# Patient Record
Sex: Female | Born: 1970 | Race: White | Hispanic: No | Smoking: Former smoker
Health system: Southern US, Community
[De-identification: ages and names within clinical notes are randomized; demographics above are authoritative.]

## PROBLEM LIST (undated history)

## (undated) DIAGNOSIS — K219 Gastro-esophageal reflux disease without esophagitis: Secondary | ICD-10-CM

## (undated) DIAGNOSIS — E88819 Insulin resistance, unspecified: Secondary | ICD-10-CM

## (undated) DIAGNOSIS — Z8742 Personal history of other diseases of the female genital tract: Secondary | ICD-10-CM

## (undated) DIAGNOSIS — J189 Pneumonia, unspecified organism: Secondary | ICD-10-CM

## (undated) DIAGNOSIS — T4145XA Adverse effect of unspecified anesthetic, initial encounter: Secondary | ICD-10-CM

## (undated) DIAGNOSIS — E8881 Metabolic syndrome: Secondary | ICD-10-CM

## (undated) DIAGNOSIS — T8859XA Other complications of anesthesia, initial encounter: Secondary | ICD-10-CM

## (undated) DIAGNOSIS — I10 Essential (primary) hypertension: Secondary | ICD-10-CM

## (undated) HISTORY — DX: Gastro-esophageal reflux disease without esophagitis: K21.9

## (undated) HISTORY — DX: Metabolic syndrome: E88.81

## (undated) HISTORY — PX: BREAST LUMPECTOMY: SHX2

## (undated) HISTORY — DX: Personal history of other diseases of the female genital tract: Z87.42

## (undated) HISTORY — DX: Essential (primary) hypertension: I10

## (undated) HISTORY — PX: CERVICAL BIOPSY: SHX590

## (undated) HISTORY — DX: Insulin resistance, unspecified: E88.819

---
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
## 1996-03-08 HISTORY — PX: DILATION AND CURETTAGE OF UTERUS: SHX78

## 1999-03-09 HISTORY — PX: TUBAL LIGATION: SHX77

## 1999-06-03 ENCOUNTER — Ambulatory Visit (HOSPITAL_COMMUNITY): Admission: RE | Admit: 1999-06-03 | Discharge: 1999-06-03 | Payer: Self-pay | Admitting: Obstetrics and Gynecology

## 1999-06-03 ENCOUNTER — Encounter: Payer: Self-pay | Admitting: Obstetrics and Gynecology

## 1999-08-07 ENCOUNTER — Encounter: Payer: Self-pay | Admitting: Obstetrics and Gynecology

## 1999-08-07 ENCOUNTER — Ambulatory Visit (HOSPITAL_COMMUNITY): Admission: RE | Admit: 1999-08-07 | Discharge: 1999-08-07 | Payer: Self-pay | Admitting: Obstetrics and Gynecology

## 1999-09-14 ENCOUNTER — Encounter: Payer: Self-pay | Admitting: Obstetrics and Gynecology

## 1999-09-14 ENCOUNTER — Ambulatory Visit (HOSPITAL_COMMUNITY): Admission: RE | Admit: 1999-09-14 | Discharge: 1999-09-14 | Payer: Self-pay | Admitting: Obstetrics and Gynecology

## 1999-11-20 ENCOUNTER — Encounter (INDEPENDENT_AMBULATORY_CARE_PROVIDER_SITE_OTHER): Payer: Self-pay | Admitting: Specialist

## 1999-11-20 ENCOUNTER — Inpatient Hospital Stay (HOSPITAL_COMMUNITY): Admission: AD | Admit: 1999-11-20 | Discharge: 1999-11-23 | Payer: Self-pay | Admitting: Obstetrics

## 2002-11-27 ENCOUNTER — Other Ambulatory Visit: Admission: RE | Admit: 2002-11-27 | Discharge: 2002-11-27 | Payer: Self-pay | Admitting: Obstetrics and Gynecology

## 2003-12-31 ENCOUNTER — Other Ambulatory Visit: Admission: RE | Admit: 2003-12-31 | Discharge: 2003-12-31 | Payer: Self-pay | Admitting: Obstetrics and Gynecology

## 2004-01-08 ENCOUNTER — Ambulatory Visit: Payer: Self-pay | Admitting: Internal Medicine

## 2004-01-17 ENCOUNTER — Ambulatory Visit: Payer: Self-pay | Admitting: Internal Medicine

## 2004-03-10 ENCOUNTER — Ambulatory Visit: Payer: Self-pay | Admitting: Internal Medicine

## 2004-03-30 ENCOUNTER — Ambulatory Visit: Payer: Self-pay | Admitting: Internal Medicine

## 2004-05-08 ENCOUNTER — Ambulatory Visit: Payer: Self-pay | Admitting: Internal Medicine

## 2004-06-03 ENCOUNTER — Ambulatory Visit: Payer: Self-pay | Admitting: Internal Medicine

## 2004-06-07 ENCOUNTER — Emergency Department (HOSPITAL_COMMUNITY): Admission: EM | Admit: 2004-06-07 | Discharge: 2004-06-07 | Payer: Self-pay | Admitting: Emergency Medicine

## 2004-06-10 ENCOUNTER — Ambulatory Visit: Payer: Self-pay | Admitting: Internal Medicine

## 2004-06-17 ENCOUNTER — Ambulatory Visit: Payer: Self-pay | Admitting: Internal Medicine

## 2004-07-31 ENCOUNTER — Ambulatory Visit: Payer: Self-pay | Admitting: Internal Medicine

## 2004-10-05 ENCOUNTER — Ambulatory Visit: Payer: Self-pay | Admitting: Internal Medicine

## 2005-01-13 ENCOUNTER — Other Ambulatory Visit: Admission: RE | Admit: 2005-01-13 | Discharge: 2005-01-13 | Payer: Self-pay | Admitting: Obstetrics and Gynecology

## 2005-02-24 ENCOUNTER — Ambulatory Visit: Payer: Self-pay | Admitting: Internal Medicine

## 2005-03-25 ENCOUNTER — Ambulatory Visit: Payer: Self-pay | Admitting: Internal Medicine

## 2005-04-01 ENCOUNTER — Ambulatory Visit: Payer: Self-pay | Admitting: Internal Medicine

## 2005-04-19 ENCOUNTER — Ambulatory Visit: Payer: Self-pay | Admitting: Internal Medicine

## 2005-10-05 ENCOUNTER — Ambulatory Visit: Payer: Self-pay | Admitting: Internal Medicine

## 2005-10-11 ENCOUNTER — Ambulatory Visit: Payer: Self-pay | Admitting: Internal Medicine

## 2005-11-05 ENCOUNTER — Ambulatory Visit (HOSPITAL_COMMUNITY): Admission: RE | Admit: 2005-11-05 | Discharge: 2005-11-05 | Payer: Self-pay | Admitting: Internal Medicine

## 2005-11-06 ENCOUNTER — Emergency Department (HOSPITAL_COMMUNITY): Admission: EM | Admit: 2005-11-06 | Discharge: 2005-11-06 | Payer: Self-pay | Admitting: Family Medicine

## 2005-11-09 ENCOUNTER — Ambulatory Visit: Payer: Self-pay | Admitting: Internal Medicine

## 2005-11-12 ENCOUNTER — Ambulatory Visit: Payer: Self-pay | Admitting: Internal Medicine

## 2005-11-17 ENCOUNTER — Ambulatory Visit: Payer: Self-pay | Admitting: Internal Medicine

## 2006-03-03 IMAGING — US US ABDOMEN COMPLETE
1 series · 14 of 25 positions shown · non-contrast
Comparison: none

CLINICAL DATA: Bilateral upper quadrant abdominal pain.  
 ABDOMINAL ULTRASOUND COMPLETE:
 No comparison. 
 The gallbladder appears normal without gallstones or wall thickening.  There is no biliary dilatation.  Views of the liver, spleen, pancreas, inferior vena cava and abdominal aorta are unremarkable.  Portions of the distal aorta are obscured by bowel gas.  
 The right kidney measures 11.1 cm in length and demonstrates mild pelviectasis.  There is no apparent caliectasis.   Centrally in the left kidney, there is an anechoic parapelvic cyst measuring approximately 4.4 x 3.9 x 3.9 cm.  The left kidney demonstrates no pelviectasis or focal cortical abnormality.

[Series 1: abdomen · 0.33mm/px · 14 of 73 slices shown]
[im 1/73]
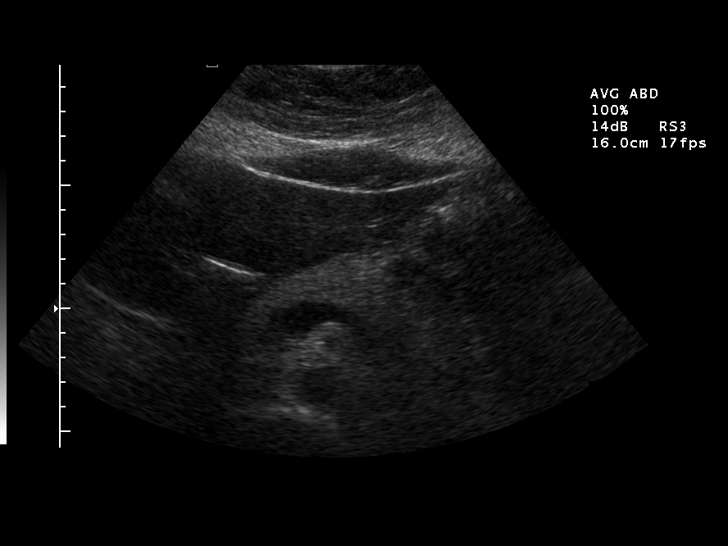
[im 7/73]
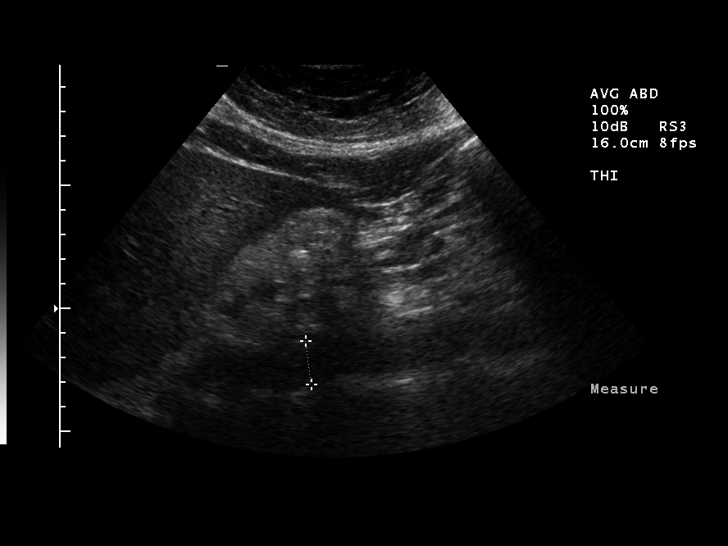
[im 13/73]
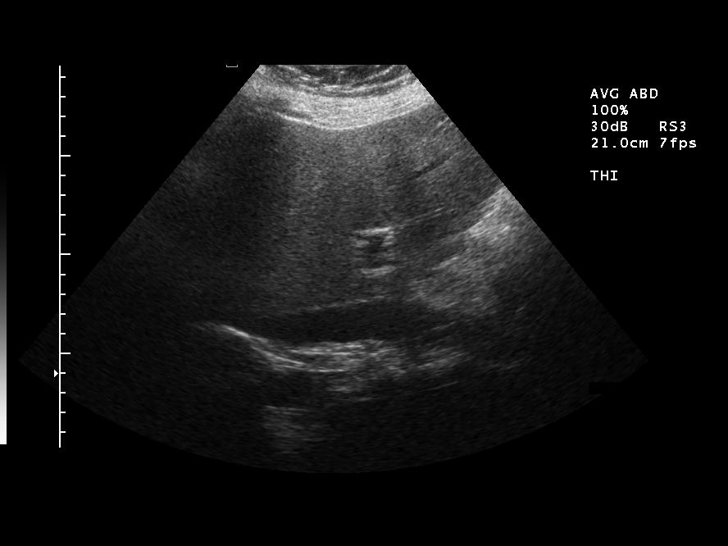
[im 19/73]
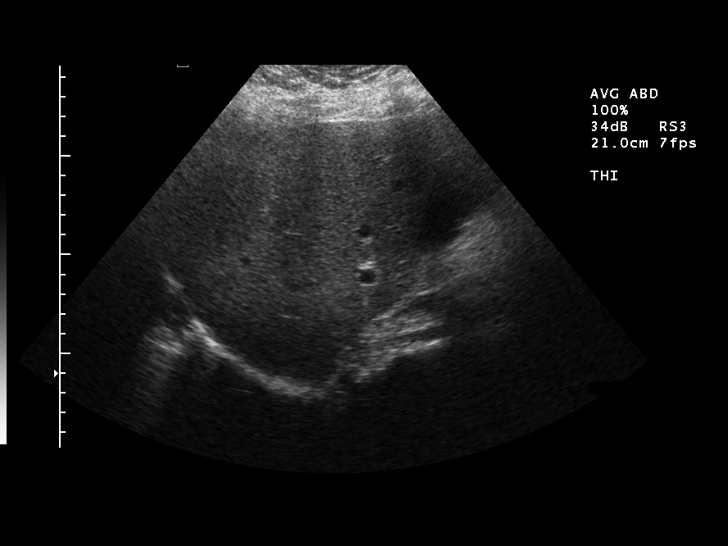
[im 25/73]
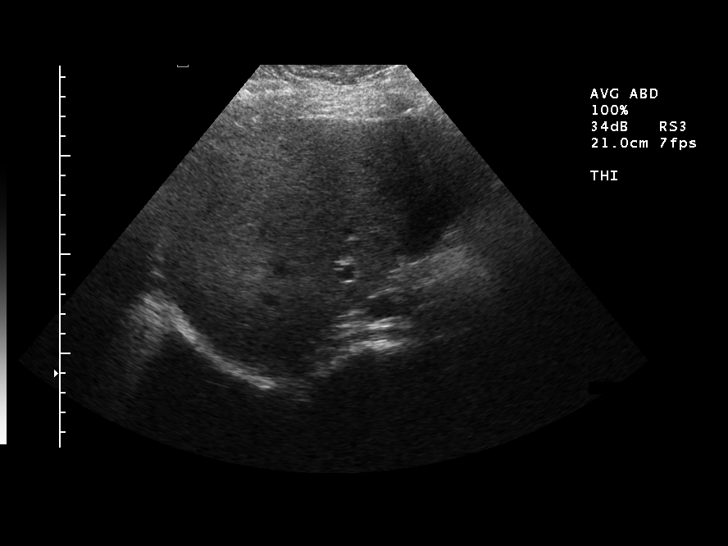
[im 28/73]
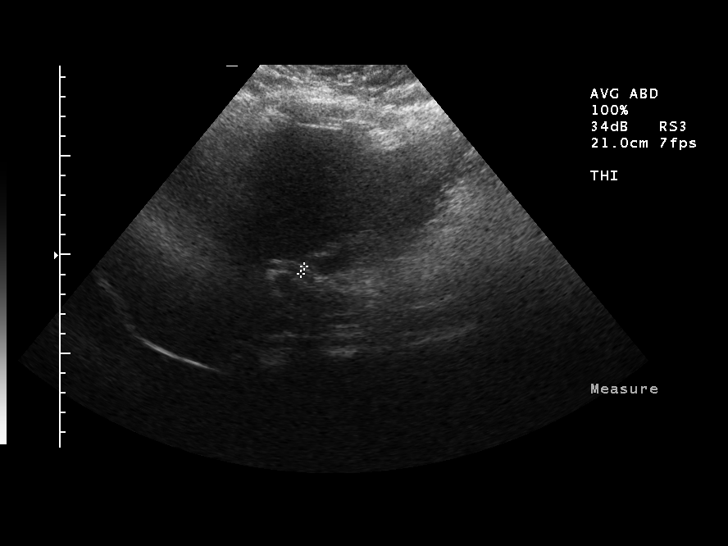
[im 34/73]
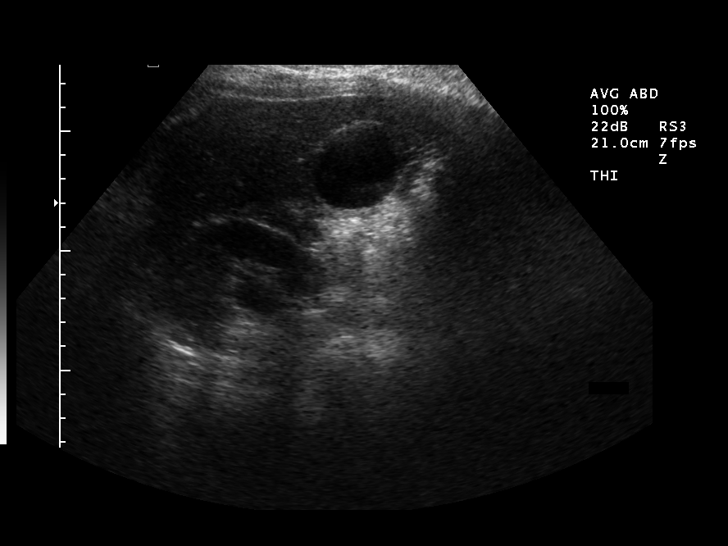
[im 40/73]
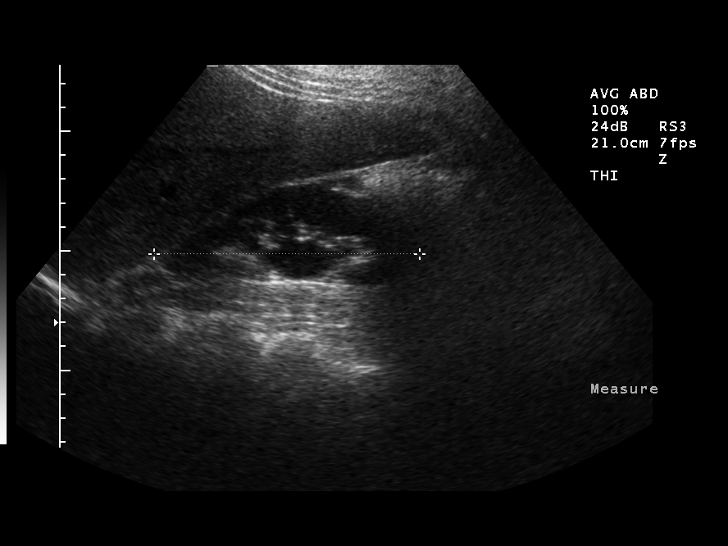
[im 46/73]
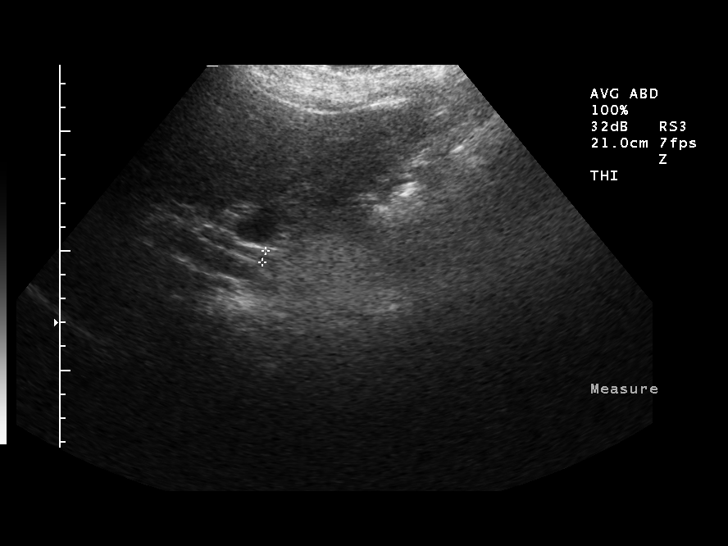
[im 49/73]
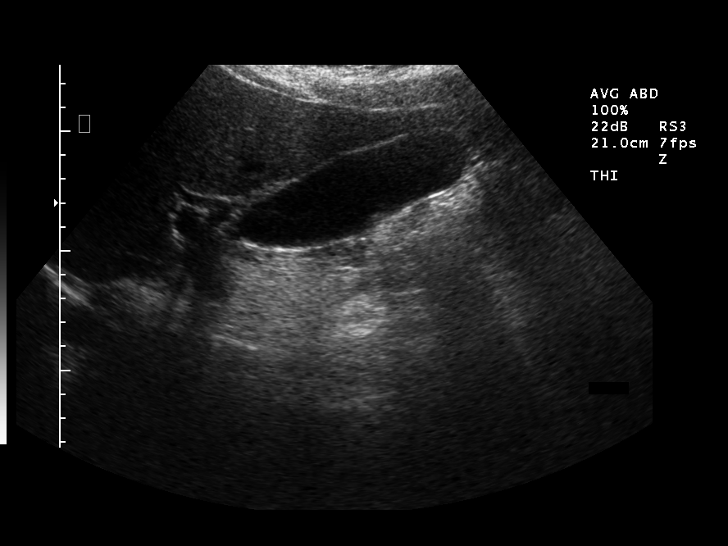
[im 55/73]
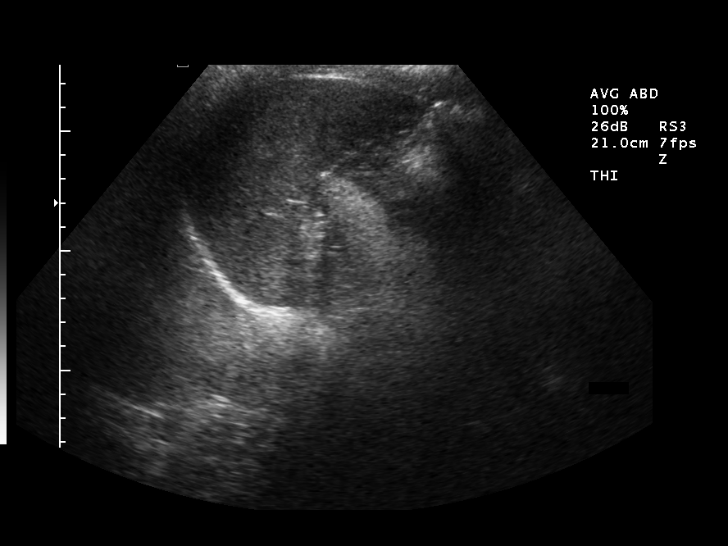
[im 61/73]
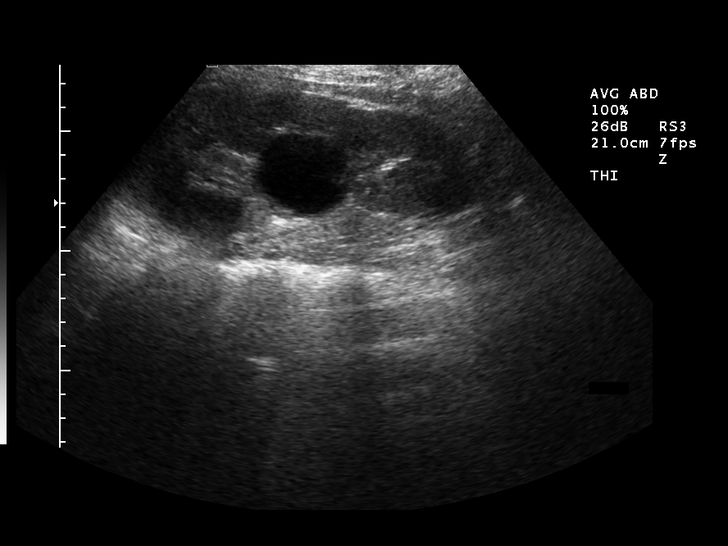
[im 67/73]
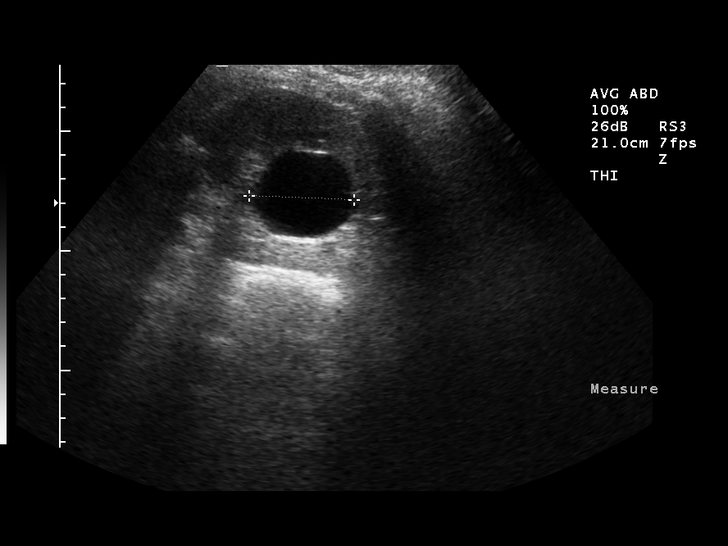
[im 73/73]
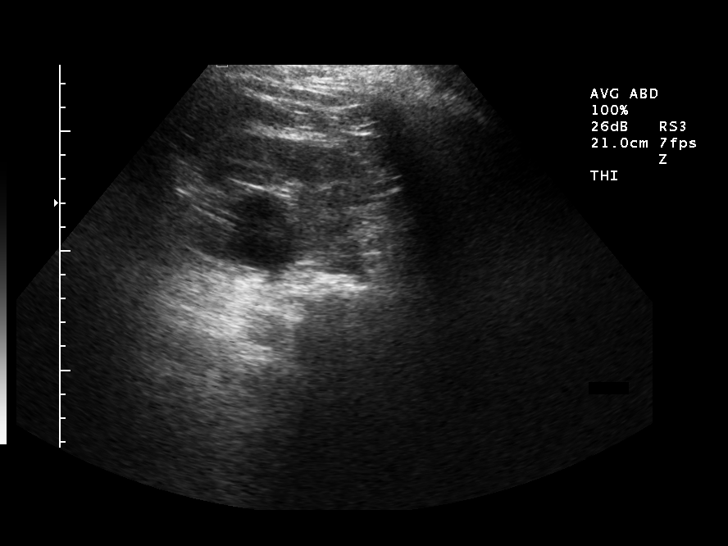

[14 of 25 positions shown; findings below may reference images not displayed]

IMPRESSION: 1.  No definite acute findings.  There is mild prominence of the right renal collecting system without definite caliectasis.  A parapelvic cyst is noted centrally in the left kidney. 
 2.  No evidence of cholelithiasis or biliary dilatation.

## 2006-03-16 ENCOUNTER — Ambulatory Visit: Payer: Self-pay | Admitting: Internal Medicine

## 2006-03-16 LAB — CONVERTED CEMR LAB
ALT: 26 units/L (ref 0–40)
AST: 21 units/L (ref 0–37)
Albumin: 4 g/dL (ref 3.5–5.2)
Alkaline Phosphatase: 57 units/L (ref 39–117)
BUN: 17 mg/dL (ref 6–23)
Basophils Absolute: 0.1 10*3/uL (ref 0.0–0.1)
Basophils Relative: 1.3 % — ABNORMAL HIGH (ref 0.0–1.0)
CO2: 28 meq/L (ref 19–32)
Calcium: 9.7 mg/dL (ref 8.4–10.5)
Chloride: 111 meq/L (ref 96–112)
Chol/HDL Ratio, serum: 3.8
Cholesterol: 149 mg/dL (ref 0–200)
Creatinine, Ser: 0.8 mg/dL (ref 0.4–1.2)
Eosinophil percent: 1.8 % (ref 0.0–5.0)
GFR calc non Af Amer: 87 mL/min
Glomerular Filtration Rate, Af Am: 105 mL/min/{1.73_m2}
Glucose, Bld: 0 mg/dL — CL (ref 70–99)
HCT: 42 % (ref 36.0–46.0)
HDL: 38.9 mg/dL — ABNORMAL LOW (ref >39.0)
Hemoglobin: 14.2 g/dL (ref 12.0–15.0)
LDL Cholesterol: 88 mg/dL (ref 0–99)
Lymphocytes Relative: 29.6 % (ref 12.0–46.0)
MCHC: 33.8 g/dL (ref 30.0–36.0)
MCV: 87.7 fL (ref 78.0–100.0)
Monocytes Absolute: 0.5 10*3/uL (ref 0.2–0.7)
Monocytes Relative: 6.6 % (ref 3.0–11.0)
Neutro Abs: 4.7 10*3/uL (ref 1.4–7.7)
Neutrophils Relative %: 60.7 % (ref 43.0–77.0)
Platelets: 326 10*3/uL (ref 150–400)
Potassium: 3.8 meq/L (ref 3.5–5.1)
RBC: 4.79 M/uL (ref 3.87–5.11)
RDW: 12.9 % (ref 11.5–14.6)
Sodium: 143 meq/L (ref 135–145)
TSH: 2.48 microintl units/mL (ref 0.35–5.50)
Total Bilirubin: 0.8 mg/dL (ref 0.3–1.2)
Total Protein: 7.1 g/dL (ref 6.0–8.3)
Triglyceride fasting, serum: 110 mg/dL (ref 0–149)
VLDL: 22 mg/dL (ref 0–40)
WBC: 7.6 10*3/uL (ref 4.5–10.5)

## 2006-03-17 ENCOUNTER — Encounter: Payer: Self-pay | Admitting: Internal Medicine

## 2006-03-17 LAB — CONVERTED CEMR LAB: Glucose, Bld: 83 mg/dL (ref 70–99)

## 2006-03-23 ENCOUNTER — Ambulatory Visit: Payer: Self-pay | Admitting: Internal Medicine

## 2006-03-24 ENCOUNTER — Encounter: Payer: Self-pay | Admitting: Internal Medicine

## 2006-06-06 ENCOUNTER — Ambulatory Visit: Payer: Self-pay | Admitting: Internal Medicine

## 2006-09-08 ENCOUNTER — Ambulatory Visit: Payer: Self-pay | Admitting: Internal Medicine

## 2006-09-29 ENCOUNTER — Telehealth: Payer: Self-pay | Admitting: Internal Medicine

## 2006-10-05 ENCOUNTER — Telehealth: Payer: Self-pay | Admitting: Internal Medicine

## 2006-12-27 ENCOUNTER — Ambulatory Visit: Payer: Self-pay | Admitting: Internal Medicine

## 2006-12-27 DIAGNOSIS — I10 Essential (primary) hypertension: Secondary | ICD-10-CM | POA: Insufficient documentation

## 2006-12-27 DIAGNOSIS — K219 Gastro-esophageal reflux disease without esophagitis: Secondary | ICD-10-CM

## 2008-02-07 ENCOUNTER — Ambulatory Visit: Payer: Self-pay | Admitting: Internal Medicine

## 2008-02-12 LAB — CONVERTED CEMR LAB
BUN: 17 mg/dL (ref 6–23)
CO2: 26 meq/L (ref 19–32)
Calcium: 9.5 mg/dL (ref 8.4–10.5)
Chloride: 104 meq/L (ref 96–112)
Creatinine, Ser: 0.8 mg/dL (ref 0.4–1.2)
GFR calc Af Amer: 104 mL/min
GFR calc non Af Amer: 86 mL/min
Glucose, Bld: 80 mg/dL (ref 70–99)
Potassium: 3.7 meq/L (ref 3.5–5.1)
Sodium: 138 meq/L (ref 135–145)

## 2008-03-19 ENCOUNTER — Ambulatory Visit: Payer: Self-pay | Admitting: Internal Medicine

## 2008-03-19 DIAGNOSIS — J069 Acute upper respiratory infection, unspecified: Secondary | ICD-10-CM | POA: Insufficient documentation

## 2008-06-06 ENCOUNTER — Emergency Department (HOSPITAL_COMMUNITY): Admission: EM | Admit: 2008-06-06 | Discharge: 2008-06-06 | Payer: Self-pay | Admitting: Family Medicine

## 2008-07-03 ENCOUNTER — Emergency Department (HOSPITAL_COMMUNITY): Admission: EM | Admit: 2008-07-03 | Discharge: 2008-07-03 | Payer: Self-pay | Admitting: Emergency Medicine

## 2008-08-22 ENCOUNTER — Ambulatory Visit: Payer: Self-pay | Admitting: Internal Medicine

## 2008-08-22 DIAGNOSIS — L723 Sebaceous cyst: Secondary | ICD-10-CM

## 2009-01-06 ENCOUNTER — Ambulatory Visit: Payer: Self-pay | Admitting: Internal Medicine

## 2009-04-29 ENCOUNTER — Telehealth: Payer: Self-pay | Admitting: Internal Medicine

## 2010-04-02 ENCOUNTER — Telehealth: Payer: Self-pay | Admitting: Internal Medicine

## 2010-04-07 NOTE — Progress Notes (Signed)
Summary: gastritis  Phone Note Call from Patient   Caller: Patient Call For: Birdie Sons MD Reason for Call: Acute Illness Summary of Call: Pt is asking for pain meds for an acute episode of gastritis.  No insurance or money for OV. [phone] Initial call taken by: Lynann Beaver CMA,  April 29, 2009 10:02 AM  Follow-up for Phone Call        what are sxs?  She has the same symptoms as before...epigasric pain that radiates to back.  No V, D or melena.  No fever. Takes Prilosec, but pain is better after taking a pill pill of her Mom's.?  Vicodin? Follow-up by: Birdie Sons MD,  April 30, 2009 8:30 AM  Additional Follow-up for Phone Call Additional follow up Details #1::        advise OTC - zantac 75 mg by mouth once daily  Additional Follow-up by: Birdie Sons MD,  April 30, 2009 10:33 AM    Additional Follow-up for Phone Call Additional follow up Details #2::    Pt given Dr. Marliss Coots recommendations. Follow-up by: Lynann Beaver CMA,  April 30, 2009 10:46 AM

## 2010-04-09 NOTE — Progress Notes (Signed)
Summary: refill  Phone Note Call from Patient Call back at [facility] Phone [phone]   Summary of Call: Needs refills of Lisniopril sent to [facility].  No insurance. Initial call taken by: [facility] CMA AAMA,  April 02, 2010 11:13 AM  Follow-up for Phone Call        Dr Cato Mulligan has not seen pt since 2009.  She came in 2010 to see Dr Kirtland Bouchard for acute illness Follow-up by: Alfred Levins, CMA,  April 02, 2010 12:03 PM  Additional Follow-up for Phone Call Additional follow up Details #1::        refill #30---needs OV Additional Follow-up by: Birdie Sons MD,  April 03, 2010 12:23 PM    Prescriptions: LISINOPRIL-HYDROCHLOROTHIAZIDE 20-25 MG TABS (LISINOPRIL-HYDROCHLOROTHIAZIDE) Take 1 tablet by mouth once a day  #30 x 0   Entered by:   Alfred Levins, CMA   Authorized by:   Birdie Sons MD   Signed by:   Alfred Levins, CMA on 04/03/2010   Method used:   Electronically to        [facility] [phone]* (retail)       [address]       [place], [place]  [address]       Ph: [phone]       Fax: [phone]   RxID:   [id number]

## 2010-07-24 NOTE — Discharge Summary (Signed)
[facility]  Patient:    Kerri Murillo                  MRN: [id number] Adm. Date:  [id number] Disc. Date: [id number] Attending:  Shaune Spittle Dictator:   Vance Gather Duplantis, C.N.M.                           Discharge Summary  ADMISSION DIAGNOSES:          1. Intrauterine pregnancy at 41-5/7 weeks.                               2. Unfavorable cervix.                               3. Negative group B strep.                               4. Multiparity, desires bilateral tubal                                  ligation.  DISCHARGE DIAGNOSES:          1. Intrauterine pregnancy at 41-5/7 weeks.                               2. Unfavorable cervix.                               3. Negative group B strep.                               4. Multiparity, desires bilateral tubal                                  ligation.                               5. Breast-feeding.  PROCEDURES:                   1. Normal spontaneous vaginal delivery of a                                  viable female infant named Alcario Drought, Apgars of 8                                  and 9, weight 7 pounds 7 ounces, on November 21, 1999, attended by [employer],  C.N.M.                               2. Postpartum bilateral tubal ligation on                                  November 22, 1999, by Dr. Dierdre Forth.  HOSPITAL COURSE:              Kerri Murillo is a 40 year old single white female, gravida 4, para 1-0-2-1, at 41-5/7 weeks, who was admitted for induction of labor secondary to postdates.  She received Cytotec two times for cervical ripening, and progressed into active labor on Cytotec.  She delivered spontaneously a viable female infant named Alcario Drought, who had Apgars of 8 and 9 and weighed 7 pounds 7 ounces, from a direct OP position at 6:54 a.m. on November 21, 1999.  She was attended at her  delivery by Vance Gather Duplantis, C.N.M.  She desired bilateral tubal ligation for sterilization and underwent the same on November 21, 1999, without complication, attended by Dr. Dierdre Forth.  Please see operative note for details.  Her postoperative course has been uneventful.  She is ambulating, voiding, and eating without difficulty.  Her vital signs are stable, and she is afebrile. She is breast-feeding also without difficulty.  She is deemed ready for discharge today.  DISCHARGE INSTRUCTIONS:       As per the [facility] handout.  DISCHARGE MEDICATIONS:        1. Motrin 600 mg p.o. q.6h. p.r.n. for pain.                               2. Tylox 1-2 p.o. q.4-6h. p.r.n. for pain.  DISCHARGE LABORATORY DATA:    Hemoglobin 11.9, WBC count 14.4, platelets 206.  FOLLOW-UP:                    Will be in six weeks at [facility] or p.r.n. DD:  11/23/99 TD:  11/24/99 Job: [id number]

## 2010-07-24 NOTE — Op Note (Signed)
[facility]  Patient:    Kerri Murillo                  MRN: [id number] Proc. Date: 11/22/99 Adm. Date:  [id number] Disc. Date: [id number] Attending:  Dierdre Forth [place]                           Operative Report  PREOPERATIVE DIAGNOSIS:       Desire for surgical sterilization.  POSTOPERATIVE DIAGNOSIS:      Desire for surgical sterilization.  OPERATION:                    Postpartum tubal sterilization.  SURGEON:                      Vanessa P. Pennie Rushing, M.D.  ANESTHESIA:                   General orotracheal.  ESTIMATED BLOOD LOSS:         Less than 20 cc.  COMPLICATIONS:                No surgical complications.  FINDINGS:                     The tubes were normal for the postpartum state.  DESCRIPTION OF PROCEDURE:     The patient was taken to the operating room after appropriate identification and placement on the operating table.  After the obtainment of adequate general anesthesia, the abdomen was prepped with multiple layers of Betadine and draped as a sterile field.  The subumbilical area was infiltrated with 6 cc of 0.25% Marcaine.  A subumbilical incision was made and blunt dissection allowed identification of the fascia which was incised and the peritoneum which was incised.  The left fallopian tube was then identified, followed to its fimbriated end, and then grasped at the isthmic portion.  A suture of 2-0 chromic was placed through the mesosalpinx and tied _______ on the knuckle of tube.  A second ligature was placed proximal to that, and the intervening knuckle of tube excised, then cauterized at the end.  A similar procedure was carried out on the opposite side.  The abdominal peritoneum was closed with a running suture of 0 Vicryl.  The fascia was closed with a running suture of 0 Vicryl.  The subcutaneous tissue was copiously irrigated and noted to be hemostatic.  A subcuticular suture of 3-0 Vicryl was used to  close the skin incision and a sterile dressing applied.                                The patient was awakened from general anesthesia and taken to the recovery room in satisfactory condition having tolerated the procedure well with sponge and instrument counts correct. DD:  11/22/99 TD:  11/24/99 Job: [id number]

## 2010-07-24 NOTE — H&P (Signed)
[facility]  Patient:    Kerri Murillo                  MRN: [id number] Adm. Date:  [id number] Disc. Date: [id number] Attending:  Shaune Spittle Dictator:   Vance Gather Duplantis, C.N.M.                         History and Physical  HISTORY OF PRESENT ILLNESS:   [name] is a 40 year old, single white female Gravida 4, Para 1-0-2-1 at 41-5/7 weeks who presents for induction of labor secondary to being post dates.  She denies nausea, vomiting, headaches, or visual disturbances.  She denies any leaking or vaginal bleeding.  She reports positive fetal movement.  Her pregnancy has been followed at [facility] by the certified nurse midwife service and has been essentially uncomplicated though at risk for history of smoking, obesity, post dates and toxemia with her previous pregnancy.  Her Group B strep is negative.  OB/GYN HISTORY:               She is a Gravida 4, Para 1-0-2-1 who had an elective abortion in 1989 with no complications.  She delivered a viable female in 1995 who weighted 8 pounds 7 ounces at [redacted] weeks gestation following a 13 hour labor.  She reports that with that labor her water broke and she required Pitocin for entering labor.  She also reports that she toxemia with that pregnancy.  In 1998 she had a miscarriage at five weeks also without complication.  Other GYN history showed she did have abnormal cells and had a conization in 1995 following her delivery but her Paps have been normal since. She did have Chlamydia at age 79 but she has been treated and her cultures have been negative since.  ALLERGIES:                    She has no known drug allergies.  GENERAL MEDICAL HISTORY:      She reports having had the usual childhood diseases.  She reports occasional urinary tract infections and she smoked prior to this pregnancy and tried to cut back during this pregnancy but still continues to smoke  some.  PAST SURGICAL HISTORY:        Her only surgery was the conization in 1995 and tonsils at age 67 and appendix at age 44.  FAMILY HISTORY:               Significant for mother with heart disease, maternal grandmother and grandfather with heart attack.  Maternal grandmother with asthma.  Mother with type 2 diabetes requiring pills.  Maternal aunt with questionable thyroid problems.  GENETIC HISTORY:              Negative.  SOCIAL HISTORY:               The patient is single. She lives with her mom. The father of the baby is not involved.  She denies any illicit drug use or alcohol or smoking once finding out she was pregnant.  PRENATAL LABORATORY DATA:     Blood type is A positive.  Her antibody screen is negative.  Syphilis is nonreactive.  Rubella was equivocal.  Hepatitis B surface antigen is negative.  HIV is nonreactive.  GC and Chlamydia are both negative.  One hour Glucola was within normal range.  Maternal serum alpha fetoprotein was declined.  Thirty-six week  beta Streptococcus was negative.  PHYSICAL EXAMINATION:  VITAL SIGNS:                  Stable.  She is afebrile.  HEENT:                        Grossly within normal limits.  HEART:                        Regular rhythm and rate.  CHEST:                        Clear.  BREASTS:                      Soft and nontender.  ABDOMEN:                      Gravid with irregular mild uterine contractions. Her fetal heart rate is very reactive and reassuring.  PELVIC:                       Closed, 50% vertex minus 2 to minus 3 and soft. EXTREMITIES:                  Within normal limits.  ASSESSMENT:                   1. Intrauterine pregnancy at 41-5/7 weeks.                               2. Unfavorable cervix.                               3. Negative Group B Streptococcus.  PLAN:                         Admit to labor and delivery for induction of labor.  Dr. Dierdre Forth has been notified of patients  admission.  We will plan Cytotec for cervical ripening and Pitocin in the morning if required. DD:  11/22/99 TD:  11/23/99 Job: [id number]

## 2010-08-04 ENCOUNTER — Encounter: Payer: Self-pay | Admitting: Internal Medicine

## 2010-08-04 ENCOUNTER — Ambulatory Visit (INDEPENDENT_AMBULATORY_CARE_PROVIDER_SITE_OTHER): Payer: Self-pay | Admitting: Internal Medicine

## 2010-08-04 DIAGNOSIS — I10 Essential (primary) hypertension: Secondary | ICD-10-CM

## 2010-08-04 MED ORDER — LISINOPRIL-HYDROCHLOROTHIAZIDE 20-25 MG PO TABS: 1.0000 | ORAL_TABLET | Freq: Every day | ORAL | Status: DC

## 2010-08-04 NOTE — Progress Notes (Signed)
  Subjective:    Patient ID: Kerri Murillo, female    DOB: 30-Apr-1970, 40 y.o.   MRN: [id number]  HPI Has been intermittently taking BP meds (no insurance)  Past Medical History  Diagnosis Date  . GERD (gastroesophageal reflux disease)   . Hypertension    Past Surgical History  Procedure Date  . Appendectomy   . Tubal ligation   . Tonsillectomy     reports that she has been smoking.  She does not have any smokeless tobacco history on file. Her alcohol and drug histories not on file. family history includes Diabetes in her mother; Heart attack in her mother; and Stroke in her mother. No Known Allergies     Review of Systems     Objective:   Physical Exam  Well-developed well-nourished female in no acute distress. HEENT exam atraumatic, normocephalic, extraocular muscles are intact. Neck is supple. No jugular venous distention no thyromegaly. Chest clear to auscultation without increased work of breathing. Cardiac exam S1 and S2 are regular. Abdominal exam active bowel sounds, soft, nontende, morbidly obeser. Extremities no edema. Neurologic exam she is alert without any motor sensory deficits. Gait is normal.        Assessment & Plan:

## 2010-08-04 NOTE — Assessment & Plan Note (Signed)
Discussed need for compliance

## 2010-10-27 ENCOUNTER — Telehealth: Payer: Self-pay | Admitting: Internal Medicine

## 2010-10-27 NOTE — Telephone Encounter (Signed)
Medicaid will not let us accept their insurance

## 2010-10-27 NOTE — Telephone Encounter (Signed)
Pt has not had insurance for quite some time and recently got medicade. Pt wanted to know if you excepted Medicade pt since she is existing.

## 2011-01-26 ENCOUNTER — Other Ambulatory Visit: Payer: Self-pay | Admitting: Obstetrics and Gynecology

## 2011-01-26 DIAGNOSIS — Z1231 Encounter for screening mammogram for malignant neoplasm of breast: Secondary | ICD-10-CM

## 2011-02-18 ENCOUNTER — Other Ambulatory Visit (INDEPENDENT_AMBULATORY_CARE_PROVIDER_SITE_OTHER): Payer: 59

## 2011-02-18 ENCOUNTER — Ambulatory Visit
Admission: RE | Admit: 2011-02-18 | Discharge: 2011-02-18 | Disposition: A | Discharge status: Home / Self Care | Payer: 59 | Source: Ambulatory Visit | Attending: Obstetrics and Gynecology | Admitting: Obstetrics and Gynecology

## 2011-02-18 ENCOUNTER — Other Ambulatory Visit: Payer: Self-pay | Admitting: Obstetrics and Gynecology

## 2011-02-18 DIAGNOSIS — Z0000 Encounter for general adult medical examination without abnormal findings: Secondary | ICD-10-CM

## 2011-02-18 DIAGNOSIS — Z1231 Encounter for screening mammogram for malignant neoplasm of breast: Secondary | ICD-10-CM

## 2011-02-18 LAB — HEPATIC FUNCTION PANEL
AST: 15 U/L (ref 0–37)
Alkaline Phosphatase: 58 U/L (ref 39–117)
Bilirubin, Direct: 0 mg/dL (ref 0.0–0.3)
Total Protein: 7.4 g/dL (ref 6.0–8.3)

## 2011-02-18 LAB — POCT URINALYSIS DIPSTICK
Bilirubin, UA: NEGATIVE
Nitrite, UA: NEGATIVE
Protein, UA: NEGATIVE
pH, UA: 7

## 2011-02-18 LAB — CBC WITH DIFFERENTIAL/PLATELET
Basophils Relative: 0.6 % (ref 0.0–3.0)
Eosinophils Absolute: 0.1 10*3/uL (ref 0.0–0.7)
Eosinophils Relative: 1.2 % (ref 0.0–5.0)
Lymphocytes Relative: 23.8 % (ref 12.0–46.0)
MCHC: 33.7 g/dL (ref 30.0–36.0)
Monocytes Relative: 6.1 % (ref 3.0–12.0)
Neutrophils Relative %: 68.3 % (ref 43.0–77.0)
RBC: 4.52 Mil/uL (ref 3.87–5.11)
WBC: 9.4 10*3/uL (ref 4.5–10.5)

## 2011-02-18 LAB — BASIC METABOLIC PANEL
Calcium: 9.3 mg/dL (ref 8.4–10.5)
Creatinine, Ser: 0.7 mg/dL (ref 0.4–1.2)
GFR: 99.99 mL/min (ref >60.00)
Sodium: 141 mEq/L (ref 135–145)

## 2011-02-18 LAB — LIPID PANEL
HDL: 42.8 mg/dL (ref >39.00)
Total CHOL/HDL Ratio: 3

## 2011-02-26 ENCOUNTER — Encounter: Payer: Self-pay | Admitting: Internal Medicine

## 2011-02-26 ENCOUNTER — Ambulatory Visit (INDEPENDENT_AMBULATORY_CARE_PROVIDER_SITE_OTHER): Payer: 59 | Admitting: Internal Medicine

## 2011-02-26 VITALS — BP 144/86 | HR 84 | Temp 98.2°F | Ht 66.0 in | Wt 350.0 lb

## 2011-02-26 DIAGNOSIS — E669 Obesity, unspecified: Secondary | ICD-10-CM

## 2011-02-26 DIAGNOSIS — I10 Essential (primary) hypertension: Secondary | ICD-10-CM

## 2011-02-26 MED ORDER — LISINOPRIL-HYDROCHLOROTHIAZIDE 20-25 MG PO TABS: 1.0000 | ORAL_TABLET | Freq: Every day | ORAL | Status: DC

## 2011-02-26 NOTE — Progress Notes (Signed)
[facility]  Past Medical History  Diagnosis Date  . GERD (gastroesophageal reflux disease)   . Hypertension     History   Social History  . Marital Status: Widowed    Spouse Name: N/A    Number of Children: N/A  . Years of Education: N/A   Occupational History  . Not on file.   Social History Main Topics  . Smoking status: Current Some Day Smoker  . Smokeless tobacco: Not on file  . Alcohol Use: Yes  . Drug Use: No  . Sexually Active: Not on file   Other Topics Concern  . Not on file   Social History Narrative  . No narrative on file    Past Surgical History  Procedure Date  . Appendectomy   . Tubal ligation   . Tonsillectomy     Family History  Problem Relation Age of Onset  . Diabetes Mother   . Stroke Mother   . Heart attack Mother     pacemaker    No Known Allergies  Current Outpatient Prescriptions on File Prior to Visit  Medication Sig Dispense Refill  . omeprazole (PRILOSEC) 20 MG capsule Take 20 mg by mouth daily.           patient denies chest pain, shortness of breath, orthopnea. Denies lower extremity edema, abdominal pain, change in appetite, change in bowel movements. Patient denies rashes, musculoskeletal complaints. No other specific complaints in a complete review of systems.   BP 144/86  Pulse 84  Temp(Src) 98.2 F (36.8 C) (Oral)  Ht 5\' 6"  (1.676 m)  Wt 350 lb (158.759 kg)  BMI 56.49 kg/m2  LMP 01/20/2011  Well-developed well-nourished female in no acute distress. HEENT exam atraumatic, normocephalic, extraocular muscles are intact. Neck is supple. No jugular venous distention no thyromegaly. Chest clear to auscultation without increased work of breathing. Cardiac exam S1 and S2 are regular. Abdominal exam active bowel sounds, soft, nontender. Morbidly obese. Extremities no edema. Neurologic exam she is alert without any motor sensory deficits. Gait is normal.  A/P: well visit, health maint UTD

## 2011-03-22 ENCOUNTER — Encounter: Payer: Self-pay | Admitting: Family

## 2011-03-22 ENCOUNTER — Ambulatory Visit (INDEPENDENT_AMBULATORY_CARE_PROVIDER_SITE_OTHER): Payer: 59 | Admitting: Family

## 2011-03-22 VITALS — BP 128/88 | Temp 98.7°F | Wt 368.0 lb

## 2011-03-22 DIAGNOSIS — J209 Acute bronchitis, unspecified: Secondary | ICD-10-CM

## 2011-03-22 DIAGNOSIS — J019 Acute sinusitis, unspecified: Secondary | ICD-10-CM

## 2011-03-22 MED ORDER — GUAIFENESIN-CODEINE 100-10 MG/5ML PO SYRP: 5.0000 mL | ORAL_SOLUTION | Freq: Three times a day (TID) | ORAL | Status: AC | PRN

## 2011-03-22 MED ORDER — FLUTICASONE PROPIONATE 50 MCG/ACT NA SUSP: 2.0000 | Freq: Every day | NASAL | Status: DC

## 2011-03-22 NOTE — Progress Notes (Signed)
  Subjective:    Patient ID: Kerri Murillo, female    DOB: [birth year], 41 y.o.   MRN: [id number]  HPI 41 year old white female, nonsmoker, patient of Dr. Cato Mulligan is in today with cough, chest and nasal congestion, sore throat and pain in her right ear that's been present for 3 days. He has been taking Mucinex and has not helped her symptoms. She denies any fever, muscle aches, or pain. It   Review of Systems  Constitutional: Negative.   HENT: Positive for congestion and postnasal drip.   Respiratory: Positive for cough.   Cardiovascular: Negative.   Genitourinary: Negative.   Musculoskeletal: Negative.   Skin: Negative.   Neurological: Negative.   Hematological: Negative.   Psychiatric/Behavioral: Negative.        Objective:   Physical Exam  Constitutional: She is oriented to person, place, and time. She appears well-developed and well-nourished.  HENT:  Right Ear: External ear normal.  Left Ear: External ear normal.  Nose: Nose normal.  Mouth/Throat: Oropharynx is clear and moist.  Cardiovascular: Normal rate, regular rhythm and normal heart sounds.   Pulmonary/Chest: Effort normal and breath sounds normal.  Musculoskeletal: Normal range of motion.  Neurological: She is alert and oriented to person, place, and time.  Skin: Skin is warm and dry.  Psychiatric: She has a normal mood and affect.          Assessment & Plan:  Assessment: Acute sinusitis, bronchitis-likely viral  Plan: Flonase 2 sprays any special once a day, Robitussin-AC 3 times a day when necessary for cough. Warned of drowsiness. Mother, Joyce Copa once daily. Drink plenty of fluids. Rest. Call the office if symptoms worsen or persist. Recheck as scheduled, and when necessary. Past Medical History  Diagnosis Date  . GERD (gastroesophageal reflux disease)   . Hypertension     History   Social History  . Marital Status: Widowed    Spouse Name: N/A    Number of Children: N/A  . Years of  Education: N/A   Occupational History  . Not on file.   Social History Main Topics  . Smoking status: Current Some Day Smoker  . Smokeless tobacco: Not on file  . Alcohol Use: Yes  . Drug Use: No  . Sexually Active: Not on file   Other Topics Concern  . Not on file   Social History Narrative  . No narrative on file    Past Surgical History  Procedure Date  . Appendectomy   . Tubal ligation   . Tonsillectomy     Family History  Problem Relation Age of Onset  . Diabetes Mother   . Stroke Mother   . Heart attack Mother     pacemaker    No Known Allergies  Current Outpatient Prescriptions on File Prior to Visit  Medication Sig Dispense Refill  . lisinopril-hydrochlorothiazide (PRINZIDE,ZESTORETIC) 20-25 MG per tablet Take 1 tablet by mouth daily.  90 tablet  3  . omeprazole (PRILOSEC) 20 MG capsule Take 20 mg by mouth daily.          BP 128/88  Temp(Src) 98.7 F (37.1 C) (Oral)  Wt 368 lb (166.924 kg)chart

## 2011-03-22 NOTE — Patient Instructions (Addendum)
Sinusitis Sinuses are air pockets within the bones of your face. The growth of bacteria within a sinus leads to infection. The infection prevents the sinuses from draining. This infection is called sinusitis. SYMPTOMS  There will be different areas of pain depending on which sinuses have become infected.  The maxillary sinuses often produce pain beneath the eyes.   Frontal sinusitis may cause pain in the middle of the forehead and above the eyes.  Other problems (symptoms) include:  Toothaches.   Colored, pus-like (purulent) drainage from the nose.   Swelling, warmth, and tenderness over the sinus areas may be signs of infection.  TREATMENT  Sinusitis is most often determined by an exam.X-rays may be taken. If x-rays have been taken, make sure you obtain your results or find out how you are to obtain them. Your caregiver may give you medications (antibiotics). These are medications that will help kill the bacteria causing the infection. You may also be given a medication (decongestant) that helps to reduce sinus swelling.  HOME CARE INSTRUCTIONS   Only take over-the-counter or prescription medicines for pain, discomfort, or fever as directed by your caregiver.   Drink extra fluids. Fluids help thin the mucus so your sinuses can drain more easily.   Applying either moist heat or ice packs to the sinus areas may help relieve discomfort.   Use saline nasal sprays to help moisten your sinuses. The sprays can be found at your local drugstore.  SEEK IMMEDIATE MEDICAL CARE IF:  You have a fever.   You have increasing pain, severe headaches, or toothache.   You have nausea, vomiting, or drowsiness.   You develop unusual swelling around the face or trouble seeing.  MAKE SURE YOU:   Understand these instructions.   Will watch your condition.   Will get help right away if you are not doing well or get worse.  Document Released: 02/22/2005 Document Revised: 11/04/2010 Document Reviewed:  09/21/2006 Exit[employer] Patient Information 2012 [employer].   Bronchitis Bronchitis is the body's way of reacting to injury and/or infection (inflammation) of the bronchi. Bronchi are the air tubes that extend from the windpipe into the lungs. If the inflammation becomes severe, it may cause shortness of breath. CAUSES  Inflammation may be caused by:  A virus.   Germs (bacteria).   Dust.   Allergens.   Pollutants and many other irritants.  The cells lining the bronchial tree are covered with tiny hairs (cilia). These constantly beat upward, away from the lungs, toward the mouth. This keeps the lungs free of pollutants. When these cells become too irritated and are unable to do their job, mucus begins to develop. This causes the characteristic cough of bronchitis. The cough clears the lungs when the cilia are unable to do their job. Without either of these protective mechanisms, the mucus would settle in the lungs. Then you would develop pneumonia. Smoking is a common cause of bronchitis and can contribute to pneumonia. Stopping this habit is the single most important thing you can do to help yourself. TREATMENT   Your caregiver may prescribe an antibiotic if the cough is caused by bacteria. Also, medicines that open up your airways make it easier to breathe. Your caregiver may also recommend or prescribe an expectorant. It will loosen the mucus to be coughed up. Only take over-the-counter or prescription medicines for pain, discomfort, or fever as directed by your caregiver.   Removing whatever causes the problem (smoking, for example) is critical to preventing the problem   from getting worse.   Cough suppressants may be prescribed for relief of cough symptoms.   Inhaled medicines may be prescribed to help with symptoms now and to help prevent problems from returning.   For those with recurrent (chronic) bronchitis, there may be a need for steroid medicines.  SEEK IMMEDIATE MEDICAL  CARE IF:   During treatment, you develop more pus-like mucus (purulent sputum).   You have a fever.   Your baby is older than 3 months with a rectal temperature of 102 F (38.9 C) or higher.   Your baby is 3 months old or younger with a rectal temperature of 100.4 F (38 C) or higher.   You become progressively more ill.   You have increased difficulty breathing, wheezing, or shortness of breath.  It is necessary to seek immediate medical care if you are elderly or sick from any other disease. MAKE SURE YOU:   Understand these instructions.   Will watch your condition.   Will get help right away if you are not doing well or get worse.  Document Released: 02/22/2005 Document Revised: 11/04/2010 Document Reviewed: 01/02/2008 Exit[employer] Patient Information 2012 [employer]. 

## 2012-01-19 ENCOUNTER — Other Ambulatory Visit: Payer: Self-pay | Admitting: Obstetrics and Gynecology

## 2012-01-19 DIAGNOSIS — Z1231 Encounter for screening mammogram for malignant neoplasm of breast: Secondary | ICD-10-CM

## 2012-02-01 ENCOUNTER — Encounter: Payer: Self-pay | Admitting: Obstetrics and Gynecology

## 2012-02-01 ENCOUNTER — Ambulatory Visit (INDEPENDENT_AMBULATORY_CARE_PROVIDER_SITE_OTHER): Payer: Medicaid Other | Admitting: Obstetrics and Gynecology

## 2012-02-01 VITALS — BP 124/80 | Temp 98.4°F | Ht 66.0 in | Wt 354.0 lb

## 2012-02-01 DIAGNOSIS — Z0000 Encounter for general adult medical examination without abnormal findings: Secondary | ICD-10-CM

## 2012-02-01 DIAGNOSIS — Z124 Encounter for screening for malignant neoplasm of cervix: Secondary | ICD-10-CM

## 2012-02-01 DIAGNOSIS — Z01419 Encounter for gynecological examination (general) (routine) without abnormal findings: Secondary | ICD-10-CM

## 2012-02-01 NOTE — Progress Notes (Signed)
Subjective:    Kerri Murillo is a 41 y.o. female, G4P2, who presents for an annual exam. The patient reports no problems.  Menstrual cycle:   LMP: Patient's last menstrual period was 01/08/2012.             Review of Systems Pertinent items are noted in HPI. Denies pelvic pain, urinary tract symptoms, vaginitis symptoms, irregular bleeding, menopausal symptoms, change in bowel habits or rectal bleeding   Objective:    BP 124/80  Temp 98.4 F (36.9 C)  Ht 5\' 6"  (1.676 m)  Wt 354 lb (160.573 kg)  BMI 57.14 kg/m2  LMP 01/08/2012   Wt Readings from Last 1 Encounters:  02/01/12 354 lb (160.573 kg)   Body mass index is 57.14 kg/(m^2). General Appearance: Alert, no acute distress HEENT: Grossly normal Neck / Thyroid: Supple, no thyromegaly or cervical adenopathy Lungs: Clear to auscultation bilaterally Back: No CVA tenderness Breast Exam: No masses or nodes.No dimpling, nipple retraction or discharge. Cardiovascular: Regular rate and rhythm.  Gastrointestinal: Soft, non-tender, no masses or organomegaly Pelvic Exam: EGBUS-wnl, vagina-normal rugae, cervix- without lesions or tenderness, uterus appears normal size shape and consistency, adnexae-no masses or tenderness (exam limited by habitus) Lymphatic Exam: Non-palpable nodes in neck, clavicular,  axillary, or inguinal regions  Skin: no rashes or abnormalities Extremities: no clubbing cyanosis or edema  Neurologic: grossly normal Psychiatric: Alert and oriented    Assessment:   Routine GYN Exam   Plan:    PAP sent RTO 1 year or prn  Rihana Kiddy,ELMIRAPA-C

## 2012-02-01 NOTE — Progress Notes (Signed)
Regular Periods: yes Mammogram: yes  Monthly Breast Ex.: yes Exercise: yes  Tetanus < 10 years: yes Seatbelts: yes  NI. Bladder Functn.: yes Abuse at home: no  Daily BM's: yes Stressful Work: yes OUT OF A JOB  Healthy Diet: yes Sigmoid-Colonoscopy: NO  Calcium: yes Medical problems this year: NONE   LAST PAP:11/12  NL  Contraception: BTL  Mammogram:  12/12 SCHEDULED 03/03/12  PCP: [facility]  PMH: NO CHANGE  FMH: NO CHANGE  Last Bone Scan: NO  PT IS SINGLE;IN A RELATIONSHIP

## 2012-02-04 LAB — PAP IG W/ RFLX HPV ASCU

## 2012-03-03 ENCOUNTER — Ambulatory Visit
Admission: RE | Admit: 2012-03-03 | Discharge: 2012-03-03 | Disposition: A | Discharge status: Home / Self Care | Payer: Medicaid Other | Source: Ambulatory Visit | Attending: Obstetrics and Gynecology | Admitting: Obstetrics and Gynecology

## 2012-03-03 DIAGNOSIS — Z1231 Encounter for screening mammogram for malignant neoplasm of breast: Secondary | ICD-10-CM

## 2012-03-07 ENCOUNTER — Other Ambulatory Visit: Payer: Self-pay | Admitting: Obstetrics and Gynecology

## 2012-03-07 DIAGNOSIS — R928 Other abnormal and inconclusive findings on diagnostic imaging of breast: Secondary | ICD-10-CM

## 2012-03-20 ENCOUNTER — Ambulatory Visit
Admission: RE | Admit: 2012-03-20 | Discharge: 2012-03-20 | Disposition: A | Discharge status: Home / Self Care | Payer: Medicaid Other | Source: Ambulatory Visit | Attending: Obstetrics and Gynecology | Admitting: Obstetrics and Gynecology

## 2012-03-20 DIAGNOSIS — R928 Other abnormal and inconclusive findings on diagnostic imaging of breast: Secondary | ICD-10-CM

## 2012-05-24 ENCOUNTER — Other Ambulatory Visit: Payer: Self-pay | Admitting: Internal Medicine

## 2012-08-29 ENCOUNTER — Other Ambulatory Visit: Payer: Self-pay | Admitting: Internal Medicine

## 2012-08-29 DIAGNOSIS — R928 Other abnormal and inconclusive findings on diagnostic imaging of breast: Secondary | ICD-10-CM

## 2012-10-02 ENCOUNTER — Ambulatory Visit
Admission: RE | Admit: 2012-10-02 | Discharge: 2012-10-02 | Disposition: A | Discharge status: Home / Self Care | Payer: Medicaid Other | Source: Ambulatory Visit | Attending: Internal Medicine | Admitting: Internal Medicine

## 2012-10-02 DIAGNOSIS — R928 Other abnormal and inconclusive findings on diagnostic imaging of breast: Secondary | ICD-10-CM

## 2013-02-19 ENCOUNTER — Other Ambulatory Visit: Payer: Self-pay | Admitting: Internal Medicine

## 2013-02-19 DIAGNOSIS — N6489 Other specified disorders of breast: Secondary | ICD-10-CM

## 2013-03-05 ENCOUNTER — Other Ambulatory Visit: Payer: Self-pay | Admitting: Internal Medicine

## 2013-03-05 ENCOUNTER — Other Ambulatory Visit: Payer: Medicaid Other

## 2013-03-05 ENCOUNTER — Ambulatory Visit
Admission: RE | Admit: 2013-03-05 | Discharge: 2013-03-05 | Disposition: A | Discharge status: Home / Self Care | Payer: PRIVATE HEALTH INSURANCE | Source: Ambulatory Visit | Attending: Internal Medicine | Admitting: Internal Medicine

## 2013-03-05 DIAGNOSIS — N6489 Other specified disorders of breast: Secondary | ICD-10-CM

## 2013-03-06 ENCOUNTER — Ambulatory Visit
Admission: RE | Admit: 2013-03-06 | Discharge: 2013-03-06 | Disposition: A | Discharge status: Home / Self Care | Payer: PRIVATE HEALTH INSURANCE | Source: Ambulatory Visit | Attending: Internal Medicine | Admitting: Internal Medicine

## 2013-03-06 ENCOUNTER — Other Ambulatory Visit: Payer: Medicaid Other

## 2013-03-06 DIAGNOSIS — N6489 Other specified disorders of breast: Secondary | ICD-10-CM

## 2013-03-26 ENCOUNTER — Ambulatory Visit (INDEPENDENT_AMBULATORY_CARE_PROVIDER_SITE_OTHER): Payer: Commercial Managed Care - PPO | Admitting: Surgery

## 2013-03-26 ENCOUNTER — Encounter (INDEPENDENT_AMBULATORY_CARE_PROVIDER_SITE_OTHER): Payer: Self-pay

## 2013-03-26 ENCOUNTER — Encounter (INDEPENDENT_AMBULATORY_CARE_PROVIDER_SITE_OTHER): Payer: Self-pay | Admitting: Surgery

## 2013-03-26 VITALS — BP 118/80 | HR 84 | Temp 98.4°F | Resp 14 | Ht 66.0 in | Wt 311.4 lb

## 2013-03-26 DIAGNOSIS — N6310 Unspecified lump in the right breast, unspecified quadrant: Secondary | ICD-10-CM | POA: Insufficient documentation

## 2013-03-26 DIAGNOSIS — N63 Unspecified lump in unspecified breast: Secondary | ICD-10-CM

## 2013-03-26 NOTE — Patient Instructions (Signed)
Lumpectomy A lumpectomy is a form of "breast conserving" or "breast preservation" surgery. It may also be referred to as a partial mastectomy. During a lumpectomy, the portion of the breast that contains the cancerous tumor or breast mass (the lump) is removed. Some normal tissue around the lump may also be removed to make sure all the tumor has been removed. This surgery should take 40 minutes or less. LET YOUR HEALTH CARE PROVIDER KNOW ABOUT:  Any allergies you have.  All medicines you are taking, including vitamins, herbs, eye drops, creams, and over-the-counter medicines.  Previous problems you or members of your family have had with the use of anesthetics.  Any blood disorders you have.  Previous surgeries you have had.  Medical conditions you have. RISKS AND COMPLICATIONS Generally, this is a safe procedure. However, as with any procedure, complications can occur. Possible complications include:  Bleeding.  Infection.  Pain.  Temporary swelling.  Change in the shape of the breast, particularly if a large portion is removed. BEFORE THE PROCEDURE  Ask your health care provider about changing or stopping your regular medicines.  Do not eat or drink anything for 7 8 hours before the surgery or as directed by your health care provider. Ask your health care provider if you can take a sip of water with any approved medicines.  On the day of surgery, your healthcare provider will use a mammogram or ultrasound to locate and mark the tumor in your breast. These markings on your breast will show where the cut (incision) will be made. PROCEDURE   An IV tube will be put into one of your veins.  You may be given medicine to help you relax before the surgery (sedative). You will be given one of the following:  A medicine that numbs the area (local anesthesia).  A medicine that makes you go to sleep (general anesthesia).  Your health care provider will use a kind of electric scalpel  that uses heat to minimize bleeding (electrocautery knife).  A curved incision (like a smile or frown) that follows the natural curve of your breast is made, to allow for minimal scarring and better healing.  The tumor will be removed with some of the surrounding tissue. This will be sent to the lab for analysis. Your health care provider may also remove your lymph nodes at this time if needed.  Sometimes, but not always, a rubber tube called a drain will be surgically inserted into your breast area or armpit to collect excess fluid that may accumulate in the space where the tumor was. This drain is connected to a plastic bulb on the outside of your body. This drain creates suction to help remove the fluid.  The incisions will be closed with stitches (sutures).  A bandage may be placed over the incisions. AFTER THE PROCEDURE  You will be taken to the recovery area.  You will be given medicine for pain.  A small rubber drain may be placed in the breast for 2 3 days to prevent a collection of blood (hematoma) from developing in the breast. You will be given instructions on caring for the drain before you go home.  A pressure bandage (dressing) will be applied for 1 2 days to prevent bleeding. Ask your health care provider how to care for your bandage at home. Document Released: 04/05/2006 Document Revised: 10/25/2012 Document Reviewed: 07/28/2012 [employer] Patient Information 2014 [employer].  

## 2013-03-26 NOTE — Progress Notes (Signed)
Patient ID: Kerri Murillo, female   DOB: 1970-08-22, 43 y.o.   MRN: [id number]  Chief Complaint  Patient presents with  . New Evaluation    eval Lft br mass    HPI Kerri Murillo is a 43 y.o. female.  Pt sent at the request of Dr Konrad SahaYacobozzi for right breast mass only visible on mammography.  Not felt on exam or seen on U/S.  Pt has no complaints.  Felt to be suspicious.  HPI  Past Medical History  Diagnosis Date  . GERD (gastroesophageal reflux disease)   . Hypertension   . Insulin resistance   . History of PCOS     Past Surgical History  Procedure Laterality Date  . Appendectomy    . Tubal ligation    . Tonsillectomy    . Cervix biopsy      Family History  Problem Relation Age of Onset  . Diabetes Mother   . Stroke Mother   . Heart attack Mother     pacemaker  . Heart disease Maternal Grandmother   . Breast cancer Maternal Grandmother   . Cancer Maternal Grandmother     breast    Social History History  Substance Use Topics  . Smoking status: Former Smoker    Quit date: 03/08/2013  . Smokeless tobacco: Never Used     Comment: PT JUST SMOKE SOCAIL  . Alcohol Use: Yes     Comment: very rarely    No Known Allergies  Current Outpatient Prescriptions  Medication Sig Dispense Refill  . fexofenadine (ALLEGRA) 60 MG tablet Take 60 mg by mouth 2 (two) times daily.      . fluticasone (FLONASE) 50 MCG/ACT nasal spray Place into both nostrils daily.      Marland Kitchen. lisinopril-hydrochlorothiazide (PRINZIDE,ZESTORETIC) 20-25 MG per tablet Take 1 tablet by mouth daily.  90 tablet  3  . omeprazole (PRILOSEC) 20 MG capsule Take 20 mg by mouth daily.        . phentermine 37.5 MG capsule Take 37.5 mg by mouth every morning.       No current facility-administered medications for this visit.    Review of Systems Review of Systems  Constitutional: Negative for fever, chills and unexpected weight change.  HENT: Negative for congestion, hearing loss, sore throat,  trouble swallowing and voice change.   Eyes: Negative for visual disturbance.  Respiratory: Negative for cough and wheezing.   Cardiovascular: Negative for chest pain, palpitations and leg swelling.  Gastrointestinal: Negative for nausea, vomiting, abdominal pain, diarrhea, constipation, blood in stool, abdominal distention and anal bleeding.  Genitourinary: Negative for hematuria, vaginal bleeding and difficulty urinating.  Musculoskeletal: Negative for arthralgias.  Skin: Negative for rash and wound.  Neurological: Negative for seizures, syncope and headaches.  Hematological: Negative for adenopathy. Does not bruise/bleed easily.  Psychiatric/Behavioral: Negative for confusion.    Blood pressure 118/80, pulse 84, temperature 98.4 F (36.9 C), temperature source Temporal, resp. rate 14, height 5\' 6"  (1.676 m), weight 311 lb 6.4 oz (141.25 kg).  Physical Exam Physical Exam  Constitutional: She is oriented to person, place, and time. She appears well-developed and well-nourished.  HENT:  Head: Normocephalic and atraumatic.  Eyes: EOM are normal. Pupils are equal, round, and reactive to light.  Neck: Normal range of motion. Neck supple.  Cardiovascular: Normal rate and regular rhythm.   Pulmonary/Chest: Right breast exhibits no inverted nipple, no mass, no nipple discharge, no skin change and no tenderness. Left breast exhibits no inverted nipple, no mass,  no nipple discharge, no skin change and no tenderness. Breasts are symmetrical.  Musculoskeletal: Normal range of motion.  Neurological: She is alert and oriented to person, place, and time.  Skin: Skin is warm and dry.  Psychiatric: She has a normal mood and affect. Her behavior is normal. Judgment and thought content normal.    Data Reviewed  CLINICAL DATA: Followup left breast focal asymmetry.  EXAM:  DIGITAL DIAGNOSTIC BILATERAL MAMMOGRAM WITH CAD  COMPARISON: Multiple priors  ACR Breast Density Category b: There are  scattered areas of  fibroglandular density.  FINDINGS:  No change in previously described focal asymmetry in the inner  posterior left breast. In the inner lower right breast, an isodense  focal asymmetry is identified, measuring up to the 1.4 cm.  Mammographic images were processed with CAD.  IMPRESSION:  1. New right breast focal asymmetry.  2. No change in previously described left breast focal asymmetry.  RECOMMENDATION:  Right breast ultrasound is recommended to further evaluate focal  asymmetry. (Patient was unable to stay for a same-day ultrasound,  and she has been scheduled for an ultrasound on 03/06/2013.) A  mammogram in one year is needed to document 2 year stability of the  left breast focal asymmetry.  I have discussed the findings and recommendations with the patient.  Results were also provided in writing at the conclusion of the  visit.  BI-RADS CATEGORY 0: Incomplete. Need additional imaging evaluation  and/or prior mammograms for comparison.  Electronically Signed  By: Jerene DillingMargaret Yacobozzi M.D.  On: 03/05/2013 16:52  Assessment    Right breast mass    Plan    Right breast needle localized lumpectomy.The procedure has been discussed with the patient. Alternatives to surgery have been discussed with the patient.  Risks of surgery include bleeding,  Infection,  Seroma formation, death,  and the need for further surgery.   The patient understands and wishes to proceed.       Liat Mayol A. 03/26/2013, 3:54 PM

## 2013-03-30 ENCOUNTER — Encounter (HOSPITAL_BASED_OUTPATIENT_CLINIC_OR_DEPARTMENT_OTHER): Payer: Self-pay | Admitting: *Deleted

## 2013-03-30 NOTE — Progress Notes (Signed)
To come in for ccs labs and ekg-denies any cardiac or resp problems-denies snoring

## 2013-04-04 ENCOUNTER — Encounter (HOSPITAL_BASED_OUTPATIENT_CLINIC_OR_DEPARTMENT_OTHER)
Admission: RE | Admit: 2013-04-04 | Discharge: 2013-04-04 | Disposition: A | Discharge status: Home / Self Care | Payer: Commercial Managed Care - PPO | Source: Ambulatory Visit | Attending: Surgery | Admitting: Surgery

## 2013-04-04 ENCOUNTER — Other Ambulatory Visit: Payer: Self-pay

## 2013-04-04 DIAGNOSIS — N63 Unspecified lump in unspecified breast: Secondary | ICD-10-CM | POA: Diagnosis present

## 2013-04-04 DIAGNOSIS — I10 Essential (primary) hypertension: Secondary | ICD-10-CM | POA: Diagnosis not present

## 2013-04-04 DIAGNOSIS — K219 Gastro-esophageal reflux disease without esophagitis: Secondary | ICD-10-CM | POA: Diagnosis not present

## 2013-04-04 DIAGNOSIS — E8881 Metabolic syndrome: Secondary | ICD-10-CM | POA: Diagnosis not present

## 2013-04-04 DIAGNOSIS — Z87891 Personal history of nicotine dependence: Secondary | ICD-10-CM | POA: Diagnosis not present

## 2013-04-04 DIAGNOSIS — N6019 Diffuse cystic mastopathy of unspecified breast: Secondary | ICD-10-CM | POA: Diagnosis not present

## 2013-04-04 LAB — CBC WITH DIFFERENTIAL/PLATELET
BASOS PCT: 1 % (ref 0–1)
Basophils Absolute: 0 10*3/uL (ref 0.0–0.1)
Eosinophils Absolute: 0.1 10*3/uL (ref 0.0–0.7)
Eosinophils Relative: 1 % (ref 0–5)
HCT: 45.5 % (ref 36.0–46.0)
Hemoglobin: 15.8 g/dL — ABNORMAL HIGH (ref 12.0–15.0)
LYMPHS ABS: 2.5 10*3/uL (ref 0.7–4.0)
Lymphocytes Relative: 29 % (ref 12–46)
MCH: 30.4 pg (ref 26.0–34.0)
MCHC: 34.7 g/dL (ref 30.0–36.0)
MCV: 87.5 fL (ref 78.0–100.0)
MONOS PCT: 7 % (ref 3–12)
Monocytes Absolute: 0.6 10*3/uL (ref 0.1–1.0)
NEUTROS ABS: 5.4 10*3/uL (ref 1.7–7.7)
Neutrophils Relative %: 63 % (ref 43–77)
Platelets: 291 10*3/uL (ref 150–400)
RBC: 5.2 MIL/uL — AB (ref 3.87–5.11)
RDW: 12.8 % (ref 11.5–15.5)
WBC: 8.5 10*3/uL (ref 4.0–10.5)

## 2013-04-04 LAB — COMPREHENSIVE METABOLIC PANEL
ALBUMIN: 3.9 g/dL (ref 3.5–5.2)
ALK PHOS: 74 U/L (ref 39–117)
ALT: 24 U/L (ref 0–35)
AST: 19 U/L (ref 0–37)
BUN: 19 mg/dL (ref 6–23)
CO2: 25 mEq/L (ref 19–32)
Calcium: 9.6 mg/dL (ref 8.4–10.5)
Chloride: 105 mEq/L (ref 96–112)
Creatinine, Ser: 0.67 mg/dL (ref 0.50–1.10)
GFR calc Af Amer: >90 mL/min (ref >90)
GFR calc non Af Amer: >90 mL/min (ref >90)
Glucose, Bld: 94 mg/dL (ref 70–99)
Potassium: 4.6 mEq/L (ref 3.7–5.3)
Sodium: 143 mEq/L (ref 137–147)
Total Bilirubin: 0.4 mg/dL (ref 0.3–1.2)
Total Protein: 7.4 g/dL (ref 6.0–8.3)

## 2013-04-05 ENCOUNTER — Encounter (HOSPITAL_BASED_OUTPATIENT_CLINIC_OR_DEPARTMENT_OTHER): Payer: Commercial Managed Care - PPO | Admitting: Anesthesiology

## 2013-04-05 ENCOUNTER — Encounter (HOSPITAL_BASED_OUTPATIENT_CLINIC_OR_DEPARTMENT_OTHER): Payer: Self-pay | Admitting: Anesthesiology

## 2013-04-05 ENCOUNTER — Encounter (HOSPITAL_BASED_OUTPATIENT_CLINIC_OR_DEPARTMENT_OTHER)
Admission: RE | Disposition: A | Discharge status: Home / Self Care | Payer: Self-pay | Source: Ambulatory Visit | Attending: Surgery

## 2013-04-05 ENCOUNTER — Ambulatory Visit
Admission: RE | Admit: 2013-04-05 | Discharge: 2013-04-05 | Disposition: A | Discharge status: Home / Self Care | Payer: PRIVATE HEALTH INSURANCE | Source: Ambulatory Visit | Attending: Surgery | Admitting: Surgery

## 2013-04-05 ENCOUNTER — Ambulatory Visit (HOSPITAL_BASED_OUTPATIENT_CLINIC_OR_DEPARTMENT_OTHER): Payer: Commercial Managed Care - PPO | Admitting: Anesthesiology

## 2013-04-05 ENCOUNTER — Ambulatory Visit (HOSPITAL_BASED_OUTPATIENT_CLINIC_OR_DEPARTMENT_OTHER)
Admission: RE | Admit: 2013-04-05 | Discharge: 2013-04-05 | Disposition: A | Discharge status: Home / Self Care | Payer: Commercial Managed Care - PPO | Source: Ambulatory Visit | Attending: Surgery | Admitting: Surgery

## 2013-04-05 DIAGNOSIS — Z87891 Personal history of nicotine dependence: Secondary | ICD-10-CM | POA: Insufficient documentation

## 2013-04-05 DIAGNOSIS — N6019 Diffuse cystic mastopathy of unspecified breast: Secondary | ICD-10-CM | POA: Insufficient documentation

## 2013-04-05 DIAGNOSIS — I10 Essential (primary) hypertension: Secondary | ICD-10-CM | POA: Insufficient documentation

## 2013-04-05 DIAGNOSIS — E8881 Metabolic syndrome: Secondary | ICD-10-CM | POA: Insufficient documentation

## 2013-04-05 DIAGNOSIS — N6310 Unspecified lump in the right breast, unspecified quadrant: Secondary | ICD-10-CM

## 2013-04-05 DIAGNOSIS — K219 Gastro-esophageal reflux disease without esophagitis: Secondary | ICD-10-CM | POA: Insufficient documentation

## 2013-04-05 HISTORY — PX: BREAST LUMPECTOMY WITH NEEDLE LOCALIZATION: SHX5759

## 2013-04-05 LAB — POCT HEMOGLOBIN-HEMACUE: HEMOGLOBIN: 15.9 g/dL — AB (ref 12.0–15.0)

## 2013-04-05 SURGERY — BREAST LUMPECTOMY WITH NEEDLE LOCALIZATION
Anesthesia: General | Site: Breast | Laterality: Right

## 2013-04-05 MED ORDER — HYDROMORPHONE HCL PF 1 MG/ML IJ SOLN
0.2500 mg | INTRAMUSCULAR | Status: DC | PRN
  Administered 2013-04-05: 0.5 mg via INTRAVENOUS
  Filled 2013-04-05: qty 1

## 2013-04-05 MED ORDER — DEXAMETHASONE SODIUM PHOSPHATE 4 MG/ML IJ SOLN
INTRAMUSCULAR | Status: DC | PRN
  Administered 2013-04-05: 10 mg via INTRAVENOUS

## 2013-04-05 MED ORDER — LACTATED RINGERS IV SOLN
INTRAVENOUS | Status: DC
  Administered 2013-04-05: 10 mL/h via INTRAVENOUS
  Administered 2013-04-05: 11:00:00 via INTRAVENOUS

## 2013-04-05 MED ORDER — FENTANYL CITRATE 0.05 MG/ML IJ SOLN
INTRAMUSCULAR | Status: AC
  Filled 2013-04-05: qty 2

## 2013-04-05 MED ORDER — LIDOCAINE HCL (CARDIAC) 20 MG/ML IV SOLN
INTRAVENOUS | Status: DC | PRN
  Administered 2013-04-05: 50 mg via INTRAVENOUS

## 2013-04-05 MED ORDER — CEFAZOLIN SODIUM-DEXTROSE 2-3 GM-% IV SOLR
INTRAVENOUS | Status: AC
  Filled 2013-04-05: qty 50

## 2013-04-05 MED ORDER — MIDAZOLAM HCL 5 MG/5ML IJ SOLN
INTRAMUSCULAR | Status: DC | PRN
  Administered 2013-04-05: 2 mg via INTRAVENOUS

## 2013-04-05 MED ORDER — BUPIVACAINE-EPINEPHRINE 0.25% -1:200000 IJ SOLN
INTRAMUSCULAR | Status: DC | PRN
  Administered 2013-04-05: 20 mL

## 2013-04-05 MED ORDER — MIDAZOLAM HCL 2 MG/2ML IJ SOLN: 1.0000 mg | INTRAMUSCULAR | Status: DC | PRN

## 2013-04-05 MED ORDER — FENTANYL CITRATE 0.05 MG/ML IJ SOLN: 50.0000 ug | INTRAMUSCULAR | Status: DC | PRN

## 2013-04-05 MED ORDER — CHLORHEXIDINE GLUCONATE 4 % EX LIQD: 1.0000 "application " | Freq: Once | CUTANEOUS | Status: DC

## 2013-04-05 MED ORDER — FENTANYL CITRATE 0.05 MG/ML IJ SOLN
INTRAMUSCULAR | Status: DC | PRN
  Administered 2013-04-05: 100 ug via INTRAVENOUS
  Administered 2013-04-05 (×4): 25 ug via INTRAVENOUS

## 2013-04-05 MED ORDER — OXYCODONE HCL 5 MG/5ML PO SOLN: 5.0000 mg | Freq: Once | ORAL | Status: DC | PRN

## 2013-04-05 MED ORDER — OXYCODONE HCL 5 MG PO TABS: 5.0000 mg | ORAL_TABLET | Freq: Once | ORAL | Status: DC | PRN

## 2013-04-05 MED ORDER — ONDANSETRON HCL 4 MG/2ML IJ SOLN: 4.0000 mg | Freq: Once | INTRAMUSCULAR | Status: DC | PRN

## 2013-04-05 MED ORDER — BUPIVACAINE-EPINEPHRINE PF 0.25-1:200000 % IJ SOLN
INTRAMUSCULAR | Status: AC
  Filled 2013-04-05: qty 30

## 2013-04-05 MED ORDER — MIDAZOLAM HCL 2 MG/2ML IJ SOLN
INTRAMUSCULAR | Status: AC
  Filled 2013-04-05: qty 2

## 2013-04-05 MED ORDER — CEFAZOLIN SODIUM 10 G IJ SOLR
3.0000 g | INTRAMUSCULAR | Status: AC
  Administered 2013-04-05: 2 g via INTRAVENOUS

## 2013-04-05 MED ORDER — ONDANSETRON HCL 4 MG/2ML IJ SOLN
INTRAMUSCULAR | Status: DC | PRN
  Administered 2013-04-05: 4 mg via INTRAVENOUS

## 2013-04-05 MED ORDER — OXYCODONE-ACETAMINOPHEN 5-325 MG PO TABS: 1.0000 | ORAL_TABLET | ORAL | Status: DC | PRN

## 2013-04-05 MED ORDER — PROPOFOL 10 MG/ML IV BOLUS
INTRAVENOUS | Status: DC | PRN
  Administered 2013-04-05: 50 mg via INTRAVENOUS
  Administered 2013-04-05: 300 mg via INTRAVENOUS

## 2013-04-05 SURGICAL SUPPLY — 54 items
ADH SKN CLS APL DERMABOND .7 (GAUZE/BANDAGES/DRESSINGS) ×1
APPLIER CLIP 11 MED OPEN (CLIP)
APR CLP MED 11 20 MLT OPN (CLIP)
BINDER BREAST LRG (GAUZE/BANDAGES/DRESSINGS) IMPLANT
BINDER BREAST MEDIUM (GAUZE/BANDAGES/DRESSINGS) IMPLANT
BINDER BREAST XLRG (GAUZE/BANDAGES/DRESSINGS) IMPLANT
BINDER BREAST XXLRG (GAUZE/BANDAGES/DRESSINGS) ×2 IMPLANT
BIOPATCH RED 1 DISK 7.0 (GAUZE/BANDAGES/DRESSINGS) IMPLANT
BIOPATCH RED 1IN DISK 7.0MM (GAUZE/BANDAGES/DRESSINGS)
BLADE SURG 15 STRL LF DISP TIS (BLADE) ×1 IMPLANT
BLADE SURG 15 STRL SS (BLADE) ×3
CANISTER SUCT 1200ML W/VALVE (MISCELLANEOUS) ×3 IMPLANT
CHLORAPREP W/TINT 26ML (MISCELLANEOUS) ×3 IMPLANT
CLIP APPLIE 11 MED OPEN (CLIP) IMPLANT
CLIP TI WIDE RED SMALL 6 (CLIP) IMPLANT
COVER MAYO STAND STRL (DRAPES) ×3 IMPLANT
COVER TABLE BACK 60X90 (DRAPES) ×3 IMPLANT
DECANTER SPIKE VIAL GLASS SM (MISCELLANEOUS) ×1 IMPLANT
DERMABOND ADVANCED (GAUZE/BANDAGES/DRESSINGS) ×2
DERMABOND ADVANCED .7 DNX12 (GAUZE/BANDAGES/DRESSINGS) ×1 IMPLANT
DEVICE DUBIN W/COMP PLATE 8390 (MISCELLANEOUS) ×2 IMPLANT
DRAIN CHANNEL 19F RND (DRAIN) IMPLANT
DRAPE LAPAROSCOPIC ABDOMINAL (DRAPES) IMPLANT
DRAPE PED LAPAROTOMY (DRAPES) ×3 IMPLANT
DRAPE UTILITY XL STRL (DRAPES) ×3 IMPLANT
ELECT COATED BLADE 2.86 ST (ELECTRODE) ×3 IMPLANT
ELECT REM PT RETURN 9FT ADLT (ELECTROSURGICAL) ×3
ELECTRODE REM PT RTRN 9FT ADLT (ELECTROSURGICAL) ×1 IMPLANT
EVACUATOR SILICONE 100CC (DRAIN) IMPLANT
GLOVE BIOGEL PI IND STRL 8 (GLOVE) ×1 IMPLANT
GLOVE BIOGEL PI INDICATOR 8 (GLOVE) ×2
GLOVE ECLIPSE 8.0 STRL XLNG CF (GLOVE) ×3 IMPLANT
GLOVE EXAM NITRILE MD LF STRL (GLOVE) ×2 IMPLANT
GLOVE SURG SS PI 7.0 STRL IVOR (GLOVE) ×2 IMPLANT
GOWN STRL REUS W/ TWL LRG LVL3 (GOWN DISPOSABLE) ×2 IMPLANT
GOWN STRL REUS W/TWL LRG LVL3 (GOWN DISPOSABLE) ×6
KIT MARKER MARGIN INK (KITS) IMPLANT
NDL HYPO 25X1 1.5 SAFETY (NEEDLE) ×1 IMPLANT
NEEDLE HYPO 25X1 1.5 SAFETY (NEEDLE) ×3 IMPLANT
NS IRRIG 1000ML POUR BTL (IV SOLUTION) ×3 IMPLANT
PACK BASIN DAY SURGERY FS (CUSTOM PROCEDURE TRAY) ×3 IMPLANT
PENCIL BUTTON HOLSTER BLD 10FT (ELECTRODE) ×3 IMPLANT
SLEEVE SCD COMPRESS KNEE MED (MISCELLANEOUS) ×3 IMPLANT
SPONGE LAP 4X18 X RAY DECT (DISPOSABLE) ×2 IMPLANT
SUT MON AB 4-0 PC3 18 (SUTURE) ×3 IMPLANT
SUT SILK 2 0 SH (SUTURE) IMPLANT
SUT VIC AB 3-0 SH 27 (SUTURE) ×3
SUT VIC AB 3-0 SH 27X BRD (SUTURE) ×1 IMPLANT
SYR CONTROL 10ML LL (SYRINGE) ×3 IMPLANT
TOWEL OR 17X24 6PK STRL BLUE (TOWEL DISPOSABLE) ×6 IMPLANT
TOWEL OR NON WOVEN STRL DISP B (DISPOSABLE) ×3 IMPLANT
TUBE CONNECTING 20'X1/4 (TUBING) ×1
TUBE CONNECTING 20X1/4 (TUBING) ×2 IMPLANT
YANKAUER SUCT BULB TIP NO VENT (SUCTIONS) ×3 IMPLANT

## 2013-04-05 NOTE — Anesthesia Postprocedure Evaluation (Signed)
  Anesthesia Post-op Note  Patient: Kerri Murillo  Procedure(s) Performed: Procedure(s): BREAST LUMPECTOMY WITH NEEDLE LOCALIZATION (Right)  Patient Location: PACU  Anesthesia Type:General  Level of Consciousness: awake, alert  and oriented  Airway and Oxygen Therapy: Patient Spontanous Breathing and Patient connected to face mask oxygen  Post-op Pain: none  Post-op Assessment: Post-op Vital signs reviewed  Post-op Vital Signs: Reviewed  Complications: No apparent anesthesia complications

## 2013-04-05 NOTE — Interval H&P Note (Signed)
History and Physical Interval Note:  04/05/2013 10:32 AM  Kerri Murillo  has presented today for surgery, with the diagnosis of right breast mass  The various methods of treatment have been discussed with the patient and family. After consideration of risks, benefits and other options for treatment, the patient has consented to  Procedure(s): BREAST LUMPECTOMY WITH NEEDLE LOCALIZATION (Right) as a surgical intervention .  The patient's history has been reviewed, patient examined, no change in status, stable for surgery.  I have reviewed the patient's chart and labs.  Questions were answered to the patient's satisfaction.     Ladamien Rammel A.

## 2013-04-05 NOTE — Anesthesia Preprocedure Evaluation (Signed)
Anesthesia Evaluation  Patient identified by MRN, date of birth, ID band Patient awake    Reviewed: Allergy & Precautions, H&P , NPO status , Patient's Chart, lab work & pertinent test results  Airway Mallampati: I TM Distance: >3 FB Neck ROM: Full    Dental  (+) Teeth Intact and Dental Advisory Given   Pulmonary former smoker,  breath sounds clear to auscultation        Cardiovascular hypertension, Pt. on medications Rhythm:Regular Rate:Normal     Neuro/Psych    GI/Hepatic GERD-  Medicated and Controlled,  Endo/Other  Morbid obesity  Renal/GU      Musculoskeletal   Abdominal   Peds  Hematology   Anesthesia Other Findings   Reproductive/Obstetrics                           Anesthesia Physical Anesthesia Plan  ASA: III  Anesthesia Plan: General   Post-op Pain Management:    Induction: Intravenous  Airway Management Planned: LMA  Additional Equipment:   Intra-op Plan:   Post-operative Plan: Extubation in OR  Informed Consent: I have reviewed the patients History and Physical, chart, labs and discussed the procedure including the risks, benefits and alternatives for the proposed anesthesia with the patient or authorized representative who has indicated his/her understanding and acceptance.   Dental advisory given  Plan Discussed with: CRNA and Anesthesiologist  Anesthesia Plan Comments:         Anesthesia Quick Evaluation

## 2013-04-05 NOTE — Anesthesia Procedure Notes (Signed)
Procedure Name: LMA Insertion Date/Time: 04/05/2013 11:08 AM Performed by: Zenia ResidesPAYNE, Alejah Aristizabal D Pre-anesthesia Checklist: Patient identified, Emergency Drugs available, Suction available and Patient being monitored Patient Re-evaluated:Patient Re-evaluated prior to inductionOxygen Delivery Method: Circle System Utilized Preoxygenation: Pre-oxygenation with 100% oxygen Intubation Type: IV induction Ventilation: Mask ventilation without difficulty LMA: LMA inserted LMA Size: 4.0 Grade View: Grade I Number of attempts: 1 Airway Equipment and Method: bite block Placement Confirmation: positive ETCO2 and CO2 detector Tube secured with: Tape Dental Injury: Teeth and Oropharynx as per pre-operative assessment

## 2013-04-05 NOTE — Transfer of Care (Signed)
Immediate Anesthesia Transfer of Care Note  Patient: Kerri Murillo  Procedure(s) Performed: Procedure(s): BREAST LUMPECTOMY WITH NEEDLE LOCALIZATION (Right)  Patient Location: PACU  Anesthesia Type:General  Level of Consciousness: awake, alert  and oriented  Airway & Oxygen Therapy: Patient Spontanous Breathing and Patient connected to face mask oxygen  Post-op Assessment: Report given to PACU RN and Post -op Vital signs reviewed and stable  Post vital signs: Reviewed and stable  Complications: No apparent anesthesia complications

## 2013-04-05 NOTE — Discharge Instructions (Signed)
°Post Anesthesia Home Care Instructions ° °Activity: °Get plenty of rest for the remainder of the day. A responsible adult should stay with you for 24 hours following the procedure.  °For the next 24 hours, DO NOT: °-Drive a car °-Operate machinery °-Drink alcoholic beverages °-Take any medication unless instructed by your physician °-Make any legal decisions or sign important papers. ° °Meals: °Start with liquid foods such as gelatin or soup. Progress to regular foods as tolerated. Avoid greasy, spicy, heavy foods. If nausea and/or vomiting occur, drink only clear liquids until the nausea and/or vomiting subsides. Call your physician if vomiting continues. ° °Special Instructions/Symptoms: °Your throat may feel dry or sore from the anesthesia or the breathing tube placed in your throat during surgery. If this causes discomfort, gargle with warm salt water. The discomfort should disappear within 24 hours. ° ° ° ° ° ° ° °[facility] °Office Phone Number [phone] ° °BREAST BIOPSY/ PARTIAL MASTECTOMY: POST OP INSTRUCTIONS ° °Always review your discharge instruction sheet given to you by the facility where your surgery was performed. ° °IF YOU HAVE DISABILITY OR FAMILY LEAVE FORMS, YOU MUST BRING THEM TO THE OFFICE FOR PROCESSING.  DO NOT GIVE THEM TO YOUR DOCTOR. ° °1. A prescription for pain medication may be given to you upon discharge.  Take your pain medication as prescribed, if needed.  If narcotic pain medicine is not needed, then you may take acetaminophen (Tylenol) or ibuprofen (Advil) as needed. °2. Take your usually prescribed medications unless otherwise directed °3. If you need a refill on your pain medication, please contact your pharmacy.  They will contact our office to request authorization.  Prescriptions will not be filled after 5pm or on week-ends. °4. You should eat very light the first 24 hours after surgery, such as soup, crackers, pudding, etc.  Resume your normal diet the  day after surgery. °5. Most patients will experience some swelling and bruising in the breast.  Ice packs and a good support bra will help.  Swelling and bruising can take several days to resolve.  °6. It is common to experience some constipation if taking pain medication after surgery.  Increasing fluid intake and taking a stool softener will usually help or prevent this problem from occurring.  A mild laxative (Milk of Magnesia or Miralax) should be taken according to package directions if there are no bowel movements after 48 hours. °7. Unless discharge instructions indicate otherwise, you may remove your bandages 24-48 hours after surgery, and you may shower at that time.  You may have steri-strips (small skin tapes) in place directly over the incision.  These strips should be left on the skin for 7-10 days.  If your surgeon used skin glue on the incision, you may shower in 24 hours.  The glue will flake off over the next 2-3 weeks.  Any sutures or staples will be removed at the office during your follow-up visit. °8. ACTIVITIES:  You may resume regular daily activities (gradually increasing) beginning the next day.  Wearing a good support bra or sports bra minimizes pain and swelling.  You may have sexual intercourse when it is comfortable. °a. You may drive when you no longer are taking prescription pain medication, you can comfortably wear a seatbelt, and you can safely maneuver your car and apply brakes. °b. RETURN TO WORK:  ______________________________________________________________________________________ °9. You should see your doctor in the office for a follow-up appointment approximately two weeks after your surgery.  Your doctor’s nurse will   typically make your follow-up appointment when she calls you with your pathology report.  Expect your pathology report 2-3 business days after your surgery.  You may call to check if you do not hear from us after three days. °10. OTHER INSTRUCTIONS:  _______________________________________________________________________________________________ _____________________________________________________________________________________________________________________________________ °_____________________________________________________________________________________________________________________________________ °_____________________________________________________________________________________________________________________________________ ° °WHEN TO CALL YOUR DOCTOR: °1. Fever over 101.0 °2. Nausea and/or vomiting. °3. Extreme swelling or bruising. °4. Continued bleeding from incision. °5. Increased pain, redness, or drainage from the incision. ° °The clinic staff is available to answer your questions during regular business hours.  Please don’t hesitate to call and ask to speak to one of the nurses for clinical concerns.  If you have a medical emergency, go to the nearest emergency room or call 911.  A surgeon from [facility] is always on call at the hospital. ° °For further questions, please visit [email or web address]  °

## 2013-04-05 NOTE — H&P (View-Only) (Signed)
Patient ID: Kerri Murillo, female   DOB: 1970-08-22, 43 y.o.   MRN: [id number]  Chief Complaint  Patient presents with  . New Evaluation    eval Lft br mass    HPI Kerri Murillo is a 43 y.o. female.  Pt sent at the request of Dr Konrad SahaYacobozzi for right breast mass only visible on mammography.  Not felt on exam or seen on U/S.  Pt has no complaints.  Felt to be suspicious.  HPI  Past Medical History  Diagnosis Date  . GERD (gastroesophageal reflux disease)   . Hypertension   . Insulin resistance   . History of PCOS     Past Surgical History  Procedure Laterality Date  . Appendectomy    . Tubal ligation    . Tonsillectomy    . Cervix biopsy      Family History  Problem Relation Age of Onset  . Diabetes Mother   . Stroke Mother   . Heart attack Mother     pacemaker  . Heart disease Maternal Grandmother   . Breast cancer Maternal Grandmother   . Cancer Maternal Grandmother     breast    Social History History  Substance Use Topics  . Smoking status: Former Smoker    Quit date: 03/08/2013  . Smokeless tobacco: Never Used     Comment: PT JUST SMOKE SOCAIL  . Alcohol Use: Yes     Comment: very rarely    No Known Allergies  Current Outpatient Prescriptions  Medication Sig Dispense Refill  . fexofenadine (ALLEGRA) 60 MG tablet Take 60 mg by mouth 2 (two) times daily.      . fluticasone (FLONASE) 50 MCG/ACT nasal spray Place into both nostrils daily.      Marland Kitchen. lisinopril-hydrochlorothiazide (PRINZIDE,ZESTORETIC) 20-25 MG per tablet Take 1 tablet by mouth daily.  90 tablet  3  . omeprazole (PRILOSEC) 20 MG capsule Take 20 mg by mouth daily.        . phentermine 37.5 MG capsule Take 37.5 mg by mouth every morning.       No current facility-administered medications for this visit.    Review of Systems Review of Systems  Constitutional: Negative for fever, chills and unexpected weight change.  HENT: Negative for congestion, hearing loss, sore throat,  trouble swallowing and voice change.   Eyes: Negative for visual disturbance.  Respiratory: Negative for cough and wheezing.   Cardiovascular: Negative for chest pain, palpitations and leg swelling.  Gastrointestinal: Negative for nausea, vomiting, abdominal pain, diarrhea, constipation, blood in stool, abdominal distention and anal bleeding.  Genitourinary: Negative for hematuria, vaginal bleeding and difficulty urinating.  Musculoskeletal: Negative for arthralgias.  Skin: Negative for rash and wound.  Neurological: Negative for seizures, syncope and headaches.  Hematological: Negative for adenopathy. Does not bruise/bleed easily.  Psychiatric/Behavioral: Negative for confusion.    Blood pressure 118/80, pulse 84, temperature 98.4 F (36.9 C), temperature source Temporal, resp. rate 14, height 5\' 6"  (1.676 m), weight 311 lb 6.4 oz (141.25 kg).  Physical Exam Physical Exam  Constitutional: She is oriented to person, place, and time. She appears well-developed and well-nourished.  HENT:  Head: Normocephalic and atraumatic.  Eyes: EOM are normal. Pupils are equal, round, and reactive to light.  Neck: Normal range of motion. Neck supple.  Cardiovascular: Normal rate and regular rhythm.   Pulmonary/Chest: Right breast exhibits no inverted nipple, no mass, no nipple discharge, no skin change and no tenderness. Left breast exhibits no inverted nipple, no mass,  no nipple discharge, no skin change and no tenderness. Breasts are symmetrical.  Musculoskeletal: Normal range of motion.  Neurological: She is alert and oriented to person, place, and time.  Skin: Skin is warm and dry.  Psychiatric: She has a normal mood and affect. Her behavior is normal. Judgment and thought content normal.    Data Reviewed  CLINICAL DATA: Followup left breast focal asymmetry.  EXAM:  DIGITAL DIAGNOSTIC BILATERAL MAMMOGRAM WITH CAD  COMPARISON: Multiple priors  ACR Breast Density Category b: There are  scattered areas of  fibroglandular density.  FINDINGS:  No change in previously described focal asymmetry in the inner  posterior left breast. In the inner lower right breast, an isodense  focal asymmetry is identified, measuring up to the 1.4 cm.  Mammographic images were processed with CAD.  IMPRESSION:  1. New right breast focal asymmetry.  2. No change in previously described left breast focal asymmetry.  RECOMMENDATION:  Right breast ultrasound is recommended to further evaluate focal  asymmetry. (Patient was unable to stay for a same-day ultrasound,  and she has been scheduled for an ultrasound on 03/06/2013.) A  mammogram in one year is needed to document 2 year stability of the  left breast focal asymmetry.  I have discussed the findings and recommendations with the patient.  Results were also provided in writing at the conclusion of the  visit.  BI-RADS CATEGORY 0: Incomplete. Need additional imaging evaluation  and/or prior mammograms for comparison.  Electronically Signed  By: Jerene DillingMargaret Yacobozzi M.D.  On: 03/05/2013 16:52  Assessment    Right breast mass    Plan    Right breast needle localized lumpectomy.The procedure has been discussed with the patient. Alternatives to surgery have been discussed with the patient.  Risks of surgery include bleeding,  Infection,  Seroma formation, death,  and the need for further surgery.   The patient understands and wishes to proceed.       Roxy Filler A. 03/26/2013, 3:54 PM

## 2013-04-05 NOTE — Op Note (Signed)
Right Breast Lumpectomy Wire localized procedure   Note  Indications: This patient presents with history of right breast mass felt to be suspicious and excision recommended since not amendable to core biopsy. The procedure has been discussed with the patient. Alternatives to surgery have been discussed with the patient.  Risks of surgery include bleeding,  Infection,  Seroma formation, death,  and the need for further surgery.   The patient understands and wishes to proceed..  Pre-operative Diagnosis: right breast mass  Post-operative Diagnosis: right breast mass  Surgeon: Alvetta Hidrogo A.   Assistants: OR staff  Anesthesia: General LMA anesthesia and Local anesthesia 0.25.% bupivacaine, with epinephrine  ASA Class: 1  Procedure Details  The patient was seen in the Holding Room. The risks, benefits, complications, treatment options, and expected outcomes were discussed with the patient. The possibilities of reaction to medication, pulmonary aspiration, bleeding, infection, the need for additional procedures, failure to diagnose a condition, and creating a complication requiring transfusion or operation were discussed with the patient. The patient concurred with the proposed plan, giving informed consent.  The site of surgery properly noted/marked. The patient was taken to Operating Room # 6, identified as Cabella L Vanguilder and the procedure verified as Breast Lumpectomy and Sentinal Node Biopsy. A Time Out was held and the above information confirmed.  After induction of anesthesia, the right arm, breast, and chest were prepped and draped in standard fashion.     The lumpectomy was performed by creating an oblique incision over the lower inner quadrant of the breastaround the previously placed localization guidewire.  Dissection was carried down to the pectoral fascia.  Specimen radiography confirmed inclusion of the mammographic lesion.  Hemostasis was achieved with cautery.  Additional  tissue was taken along the anterior margin and submitted separately to pathology after providing orientation for the pathology.  The wound was irrigated and closed with a 3-0  Vicryl  And 4 O monocryl subcuticular closure in layers.    Sterile dressings were applied. At the end of the operation, all sponge, instrument, and needle counts were correct.  Findings: grossly clear surgical margins  Estimated Blood Loss:  Minimal         Drains: none         Total IV Fluids: 400 ml         Specimens: breast tissue and wire                 Complications:  None; patient tolerated the procedure well.         Disposition: PACU - hemodynamically stable.         Condition: stable  Attending Attestation: I performed the procedure.

## 2013-04-06 ENCOUNTER — Encounter (HOSPITAL_BASED_OUTPATIENT_CLINIC_OR_DEPARTMENT_OTHER): Payer: Self-pay | Admitting: Surgery

## 2013-04-10 ENCOUNTER — Telehealth (INDEPENDENT_AMBULATORY_CARE_PROVIDER_SITE_OTHER): Payer: Self-pay

## 2013-04-10 NOTE — Telephone Encounter (Signed)
Called pt with benign path results. Patient will follow up on 2/16.

## 2013-04-10 NOTE — Telephone Encounter (Signed)
Message copied by Brennan BaileyBROOKS, Trevar Boehringer on Tue Apr 10, 2013  2:31 PM ------      Message from: Harriette BouillonORNETT, THOMAS A      Created: Tue Apr 10, 2013 11:58 AM       Benign ------

## 2013-04-23 ENCOUNTER — Ambulatory Visit (INDEPENDENT_AMBULATORY_CARE_PROVIDER_SITE_OTHER): Payer: Medicaid Other | Admitting: Surgery

## 2013-04-23 ENCOUNTER — Encounter (INDEPENDENT_AMBULATORY_CARE_PROVIDER_SITE_OTHER): Payer: Self-pay | Admitting: Surgery

## 2013-04-23 VITALS — BP 136/84 | HR 96 | Temp 98.5°F | Resp 15 | Ht 66.5 in | Wt 307.2 lb

## 2013-04-23 DIAGNOSIS — Z9889 Other specified postprocedural states: Secondary | ICD-10-CM

## 2013-04-23 NOTE — Progress Notes (Signed)
pt returns 2 weeks  after right breast needle localized lumpectomy. She is doing well.   Breast, lumpectomy, Right - BREAST PARENCHYMA WITH FIBROCYSTIC CHANGES AND AREA OF SLIGHT INCREASED STROMAL CELLULARITY, SEE COMMENT. - NO ATYPIA OR MALIGNANCY IDENTIFIED.    Exam: Right breast incision clean dry and intact without signs of infection  Impression: 2 weeks status post right breast needle localized lumpectomy for fibrocystic change and mammographic abnormality  Plan: Resume yearly screening. Resume full activity. Followup as needed.

## 2013-04-23 NOTE — Patient Instructions (Signed)
Return as needed.  Resume full activity. 

## 2013-07-16 ENCOUNTER — Observation Stay (HOSPITAL_COMMUNITY)
Admission: EM | Admit: 2013-07-16 | Discharge: 2013-07-18 | Disposition: A | Discharge status: Home / Self Care | Payer: Commercial Managed Care - PPO | Attending: General Surgery | Admitting: General Surgery

## 2013-07-16 ENCOUNTER — Encounter (HOSPITAL_COMMUNITY): Payer: Self-pay | Admitting: Emergency Medicine

## 2013-07-16 ENCOUNTER — Emergency Department (HOSPITAL_COMMUNITY): Payer: Commercial Managed Care - PPO

## 2013-07-16 DIAGNOSIS — E8881 Metabolic syndrome: Secondary | ICD-10-CM | POA: Diagnosis not present

## 2013-07-16 DIAGNOSIS — K8000 Calculus of gallbladder with acute cholecystitis without obstruction: Secondary | ICD-10-CM | POA: Diagnosis not present

## 2013-07-16 DIAGNOSIS — K8010 Calculus of gallbladder with chronic cholecystitis without obstruction: Secondary | ICD-10-CM | POA: Insufficient documentation

## 2013-07-16 DIAGNOSIS — K219 Gastro-esophageal reflux disease without esophagitis: Secondary | ICD-10-CM | POA: Insufficient documentation

## 2013-07-16 DIAGNOSIS — R1011 Right upper quadrant pain: Secondary | ICD-10-CM | POA: Diagnosis present

## 2013-07-16 DIAGNOSIS — I10 Essential (primary) hypertension: Secondary | ICD-10-CM | POA: Insufficient documentation

## 2013-07-16 DIAGNOSIS — Z6841 Body Mass Index (BMI) 40.0 and over, adult: Secondary | ICD-10-CM | POA: Insufficient documentation

## 2013-07-16 DIAGNOSIS — K8020 Calculus of gallbladder without cholecystitis without obstruction: Secondary | ICD-10-CM | POA: Diagnosis present

## 2013-07-16 DIAGNOSIS — Z87891 Personal history of nicotine dependence: Secondary | ICD-10-CM | POA: Insufficient documentation

## 2013-07-16 HISTORY — DX: Pneumonia, unspecified organism: J18.9

## 2013-07-16 HISTORY — DX: Adverse effect of unspecified anesthetic, initial encounter: T41.45XA

## 2013-07-16 HISTORY — DX: Other complications of anesthesia, initial encounter: T88.59XA

## 2013-07-16 LAB — CBC WITH DIFFERENTIAL/PLATELET
BASOS ABS: 0 10*3/uL (ref 0.0–0.1)
BASOS PCT: 0 % (ref 0–1)
Eosinophils Absolute: 0 10*3/uL (ref 0.0–0.7)
Eosinophils Relative: 0 % (ref 0–5)
HCT: 42.5 % (ref 36.0–46.0)
Hemoglobin: 14.4 g/dL (ref 12.0–15.0)
Lymphocytes Relative: 8 % — ABNORMAL LOW (ref 12–46)
Lymphs Abs: 1.2 10*3/uL (ref 0.7–4.0)
MCH: 30 pg (ref 26.0–34.0)
MCHC: 33.9 g/dL (ref 30.0–36.0)
MCV: 88.5 fL (ref 78.0–100.0)
Monocytes Absolute: 0.6 10*3/uL (ref 0.1–1.0)
Monocytes Relative: 4 % (ref 3–12)
NEUTROS PCT: 88 % — AB (ref 43–77)
Neutro Abs: 12.7 10*3/uL — ABNORMAL HIGH (ref 1.7–7.7)
PLATELETS: 275 10*3/uL (ref 150–400)
RBC: 4.8 MIL/uL (ref 3.87–5.11)
RDW: 12.9 % (ref 11.5–15.5)
WBC: 14.5 10*3/uL — ABNORMAL HIGH (ref 4.0–10.5)

## 2013-07-16 LAB — BASIC METABOLIC PANEL
BUN: 17 mg/dL (ref 6–23)
CALCIUM: 9.4 mg/dL (ref 8.4–10.5)
CHLORIDE: 106 meq/L (ref 96–112)
CO2: 22 mEq/L (ref 19–32)
Creatinine, Ser: 0.59 mg/dL (ref 0.50–1.10)
GFR calc non Af Amer: >90 mL/min (ref >90)
Glucose, Bld: 119 mg/dL — ABNORMAL HIGH (ref 70–99)
Potassium: 4 mEq/L (ref 3.7–5.3)
Sodium: 139 mEq/L (ref 137–147)

## 2013-07-16 LAB — LIPASE, BLOOD: Lipase: 21 U/L (ref 11–59)

## 2013-07-16 LAB — URINALYSIS, ROUTINE W REFLEX MICROSCOPIC
Bilirubin Urine: NEGATIVE
GLUCOSE, UA: NEGATIVE mg/dL
HGB URINE DIPSTICK: NEGATIVE
Ketones, ur: 15 mg/dL — AB
Nitrite: NEGATIVE
Protein, ur: NEGATIVE mg/dL
SPECIFIC GRAVITY, URINE: 1.03 (ref 1.005–1.030)
Urobilinogen, UA: 0.2 mg/dL (ref 0.0–1.0)
pH: 6 (ref 5.0–8.0)

## 2013-07-16 LAB — HEPATIC FUNCTION PANEL
ALBUMIN: 3.4 g/dL — AB (ref 3.5–5.2)
ALT: 19 U/L (ref 0–35)
AST: 16 U/L (ref 0–37)
Alkaline Phosphatase: 60 U/L (ref 39–117)
TOTAL PROTEIN: 6.6 g/dL (ref 6.0–8.3)
Total Bilirubin: 0.3 mg/dL (ref 0.3–1.2)

## 2013-07-16 LAB — URINE MICROSCOPIC-ADD ON

## 2013-07-16 LAB — TROPONIN I

## 2013-07-16 LAB — PREGNANCY, URINE: PREG TEST UR: NEGATIVE

## 2013-07-16 MED ORDER — MORPHINE SULFATE 2 MG/ML IJ SOLN
2.0000 mg | INTRAMUSCULAR | Status: DC | PRN
  Administered 2013-07-16 (×2): 2 mg via INTRAVENOUS
  Filled 2013-07-16 (×2): qty 1

## 2013-07-16 MED ORDER — PANTOPRAZOLE SODIUM 40 MG PO TBEC
40.0000 mg | DELAYED_RELEASE_TABLET | Freq: Every day | ORAL | Status: DC
  Administered 2013-07-16 – 2013-07-18 (×2): 40 mg via ORAL
  Filled 2013-07-16 (×2): qty 1

## 2013-07-16 MED ORDER — ONDANSETRON HCL 4 MG/2ML IJ SOLN: 4.0000 mg | Freq: Four times a day (QID) | INTRAMUSCULAR | Status: DC | PRN

## 2013-07-16 MED ORDER — ACETAMINOPHEN 650 MG RE SUPP: 650.0000 mg | Freq: Four times a day (QID) | RECTAL | Status: DC | PRN

## 2013-07-16 MED ORDER — ENOXAPARIN SODIUM 40 MG/0.4ML ~~LOC~~ SOLN
40.0000 mg | SUBCUTANEOUS | Status: DC
  Administered 2013-07-16 – 2013-07-17 (×2): 40 mg via SUBCUTANEOUS
  Filled 2013-07-16 (×4): qty 0.4

## 2013-07-16 MED ORDER — SODIUM CHLORIDE 0.9 % IV SOLN
INTRAVENOUS | Status: DC
  Administered 2013-07-16 – 2013-07-17 (×4): via INTRAVENOUS

## 2013-07-16 MED ORDER — OXYCODONE HCL 5 MG PO TABS
5.0000 mg | ORAL_TABLET | ORAL | Status: DC | PRN
  Administered 2013-07-16 – 2013-07-18 (×3): 5 mg via ORAL
  Filled 2013-07-16 (×3): qty 1

## 2013-07-16 MED ORDER — LORATADINE 10 MG PO TABS
10.0000 mg | ORAL_TABLET | Freq: Every day | ORAL | Status: DC
  Administered 2013-07-16 – 2013-07-18 (×2): 10 mg via ORAL
  Filled 2013-07-16 (×4): qty 1

## 2013-07-16 MED ORDER — LISINOPRIL 10 MG PO TABS
10.0000 mg | ORAL_TABLET | Freq: Every day | ORAL | Status: DC
  Administered 2013-07-16 – 2013-07-18 (×2): 10 mg via ORAL
  Filled 2013-07-16 (×3): qty 1

## 2013-07-16 MED ORDER — CIPROFLOXACIN IN D5W 400 MG/200ML IV SOLN
400.0000 mg | Freq: Two times a day (BID) | INTRAVENOUS | Status: DC
  Administered 2013-07-16 – 2013-07-17 (×4): 400 mg via INTRAVENOUS
  Filled 2013-07-16 (×5): qty 200

## 2013-07-16 MED ORDER — HYDROMORPHONE HCL PF 1 MG/ML IJ SOLN
1.0000 mg | INTRAMUSCULAR | Status: DC | PRN
  Administered 2013-07-16 – 2013-07-18 (×13): 1 mg via INTRAVENOUS
  Filled 2013-07-16 (×13): qty 1

## 2013-07-16 MED ORDER — PNEUMOCOCCAL VAC POLYVALENT 25 MCG/0.5ML IJ INJ
0.5000 mL | INJECTION | INTRAMUSCULAR | Status: DC
  Filled 2013-07-16: qty 0.5

## 2013-07-16 MED ORDER — HYDROCHLOROTHIAZIDE 12.5 MG PO CAPS
12.5000 mg | ORAL_CAPSULE | Freq: Every day | ORAL | Status: DC
  Administered 2013-07-16 – 2013-07-18 (×2): 12.5 mg via ORAL
  Filled 2013-07-16 (×3): qty 1

## 2013-07-16 MED ORDER — LISINOPRIL-HYDROCHLOROTHIAZIDE 20-25 MG PO TABS: 0.5000 | ORAL_TABLET | Freq: Every day | ORAL | Status: DC

## 2013-07-16 MED ORDER — ONDANSETRON HCL 4 MG/2ML IJ SOLN
4.0000 mg | Freq: Four times a day (QID) | INTRAMUSCULAR | Status: DC | PRN
  Administered 2013-07-16 (×2): 4 mg via INTRAVENOUS
  Filled 2013-07-16 (×2): qty 2

## 2013-07-16 MED ORDER — ACETAMINOPHEN 325 MG PO TABS: 650.0000 mg | ORAL_TABLET | Freq: Four times a day (QID) | ORAL | Status: DC | PRN

## 2013-07-16 NOTE — ED Notes (Signed)
Patient transported to Ultrasound 

## 2013-07-16 NOTE — ED Notes (Signed)
Pt from home, c/o RUQ radiating to back, denies N/V/D, sob or cp.

## 2013-07-16 NOTE — ED Notes (Signed)
Surgery PA at bedside.  

## 2013-07-16 NOTE — ED Provider Notes (Signed)
I saw and evaluated the patient, reviewed the resident's note and I agree with the findings and plan. If applicable, I agree with the resident's interpretation of the EKG.  If applicable, I was present for critical portions of any procedures performed.  RUQ and epigastric pain with nausea since last night, worse with eating. TTP RUQ and epigastrium with voluntary guarding. LFTs, lipase normal. WBC 14 Large gall stone on US, d/w surgery.  Glynn OctaveStephen Averianna Brugger, MD 07/16/13 1745

## 2013-07-16 NOTE — ED Notes (Signed)
MD at bedside. 

## 2013-07-16 NOTE — H&P (Signed)
Chief Complaint: abdominal pain  HPI: Kerri Murillo is a 43 year old female with a history of HTN and obesity who presents with abdominal pain.  Onset of symptoms was last night at midnight.  Location is epigastric region, band like towards the back.  Characterized as sharp stabbing pain.  15/10 in severity.  No aggravating or alleviating factors.  No modifying factors.  Previous symptoms intermittently when eating high acidic or fatty foods.  Associated symptoms include; chills and sweats.  No n/v/d.  Previous surgeries include an appendectomy, tubal ligation and a right breast lumpectomy by Dr. Brantley Stage in February 2015.  She denies any cardiac history.  She does not take anticoagulation.  Last oral intake was last evening.  At present time, she continues to rate her pain at 15/10 and states the morphine temporarily "dulls the pain."  White count is 14K, LFTs are normal.  An US shows gallstones without evidence of acute cholecystitis.    Past Medical History  Diagnosis Date  . GERD (gastroesophageal reflux disease)   . Hypertension   . Insulin resistance   . History of PCOS     Past Surgical History  Procedure Laterality Date  . Appendectomy    . Tubal ligation    . Tonsillectomy    . Cervix biopsy    . Breast lumpectomy with needle localization Right 04/05/2013    Procedure: BREAST LUMPECTOMY WITH NEEDLE LOCALIZATION;  Surgeon: Marcello Moores A. Cornett, MD;  Location: [facility];  Service: General;  Laterality: Right;    Family History  Problem Relation Age of Onset  . Diabetes Mother   . Stroke Mother   . Heart attack Mother     pacemaker  . Heart disease Maternal Grandmother   . Breast cancer Maternal Grandmother   . Cancer Maternal Grandmother     breast   Social History:  reports that she quit smoking about 4 months ago. She has never used smokeless tobacco. She reports that she drinks alcohol. She reports that she does not use illicit drugs.  Allergies: No  Known Allergies  Scheduled Meds:  Continuous Infusions:  PRN Meds:.morphine injection, ondansetron   (Not in a hospital admission)  Results for orders placed during the hospital encounter of 07/16/13 (from the past 48 hour(s))  TROPONIN I     Status: None   Collection Time    07/16/13  8:25 AM      Result Value Ref Range   Troponin I <0.30  <0.30 ng/mL   Comment:            Due to the release kinetics of cTnI,     a negative result within the first hours     of the onset of symptoms does not rule out     myocardial infarction with certainty.     If myocardial infarction is still suspected,     repeat the test at appropriate intervals.  BASIC METABOLIC PANEL     Status: Abnormal   Collection Time    07/16/13  8:35 AM      Result Value Ref Range   Sodium 139  137 - 147 mEq/L   Potassium 4.0  3.7 - 5.3 mEq/L   Chloride 106  96 - 112 mEq/L   CO2 22  19 - 32 mEq/L   Glucose, Bld 119 (*) 70 - 99 mg/dL   BUN 17  6 - 23 mg/dL   Creatinine, Ser 0.59  0.50 - 1.10 mg/dL   Calcium 9.4  8.4 - 10.5 mg/dL   GFR calc non Af Amer >90  >90 mL/min   GFR calc Af Amer >90  >90 mL/min   Comment: (NOTE)     The eGFR has been calculated using the CKD EPI equation.     This calculation has not been validated in all clinical situations.     eGFR's persistently <90 mL/min signify possible Chronic Kidney     Disease.  CBC WITH DIFFERENTIAL     Status: Abnormal   Collection Time    07/16/13  8:35 AM      Result Value Ref Range   WBC 14.5 (*) 4.0 - 10.5 K/uL   RBC 4.80  3.87 - 5.11 MIL/uL   Hemoglobin 14.4  12.0 - 15.0 g/dL   HCT 42.5  36.0 - 46.0 %   MCV 88.5  78.0 - 100.0 fL   MCH 30.0  26.0 - 34.0 pg   MCHC 33.9  30.0 - 36.0 g/dL   RDW 12.9  11.5 - 15.5 %   Platelets 275  150 - 400 K/uL   Neutrophils Relative % 88 (*) 43 - 77 %   Neutro Abs 12.7 (*) 1.7 - 7.7 K/uL   Lymphocytes Relative 8 (*) 12 - 46 %   Lymphs Abs 1.2  0.7 - 4.0 K/uL   Monocytes Relative 4  3 - 12 %   Monocytes  Absolute 0.6  0.1 - 1.0 K/uL   Eosinophils Relative 0  0 - 5 %   Eosinophils Absolute 0.0  0.0 - 0.7 K/uL   Basophils Relative 0  0 - 1 %   Basophils Absolute 0.0  0.0 - 0.1 K/uL  HEPATIC FUNCTION PANEL     Status: Abnormal   Collection Time    07/16/13  8:35 AM      Result Value Ref Range   Total Protein 6.6  6.0 - 8.3 g/dL   Albumin 3.4 (*) 3.5 - 5.2 g/dL   AST 16  0 - 37 U/L   ALT 19  0 - 35 U/L   Alkaline Phosphatase 60  39 - 117 U/L   Total Bilirubin 0.3  0.3 - 1.2 mg/dL   Bilirubin, Direct <0.2  0.0 - 0.3 mg/dL   Indirect Bilirubin NOT CALCULATED  0.3 - 0.9 mg/dL  LIPASE, BLOOD     Status: None   Collection Time    07/16/13  8:35 AM      Result Value Ref Range   Lipase 21  11 - 59 U/L  URINALYSIS, ROUTINE W REFLEX MICROSCOPIC     Status: Abnormal   Collection Time    07/16/13  9:00 AM      Result Value Ref Range   Color, Urine YELLOW  YELLOW   APPearance CLOUDY (*) CLEAR   Specific Gravity, Urine 1.030  1.005 - 1.030   pH 6.0  5.0 - 8.0   Glucose, UA NEGATIVE  NEGATIVE mg/dL   Hgb urine dipstick NEGATIVE  NEGATIVE   Bilirubin Urine NEGATIVE  NEGATIVE   Ketones, ur 15 (*) NEGATIVE mg/dL   Protein, ur NEGATIVE  NEGATIVE mg/dL   Urobilinogen, UA 0.2  0.0 - 1.0 mg/dL   Nitrite NEGATIVE  NEGATIVE   Leukocytes, UA MODERATE (*) NEGATIVE  PREGNANCY, URINE     Status: None   Collection Time    07/16/13  9:00 AM      Result Value Ref Range   Preg Test, Ur NEGATIVE  NEGATIVE   Comment:  THE SENSITIVITY OF THIS     METHODOLOGY IS >20 mIU/mL.  URINE MICROSCOPIC-ADD ON     Status: Abnormal   Collection Time    07/16/13  9:00 AM      Result Value Ref Range   Squamous Epithelial / LPF FEW (*) RARE   WBC, UA 11-20  <3 WBC/hpf   RBC / HPF 0-2  <3 RBC/hpf   Bacteria, UA FEW (*) RARE   US Abdomen Limited Ruq  07/16/2013   CLINICAL DATA:  Right upper quadrant abdominal pain  EXAM: US ABDOMEN LIMITED - RIGHT UPPER QUADRANT  COMPARISON:  None.  FINDINGS: Gallbladder:   Gallstones are identified, largest 2.6 cm stone at the level of the gallbladder neck. No gallbladder wall thickening or pericholecystic fluid. No sonographic Murphy sign.  Common bile duct:  Diameter: 6 mm  Liver:  No focal lesion identified. Within normal limits in parenchymal echogenicity.  IMPRESSION: Gallstones without other sonographic evidence for acute cholecystitis.   Electronically Signed   By: Conchita Paris M.D.   On: 07/16/2013 10:43    Review of Systems  All other systems reviewed and are negative.   Blood pressure 115/64, pulse 64, temperature 98.1 F (36.7 C), temperature source Oral, height _0  (1.702 m), weight 305 lb (138.347 kg), SpO2 100.00%. Physical Exam  Constitutional: She appears well-developed and well-nourished. No distress.  HENT:  Head: Normocephalic and atraumatic.  Neck: Normal range of motion. Neck supple.  Cardiovascular: Normal rate, regular rhythm, normal heart sounds and intact distal pulses.  Exam reveals no gallop and no friction rub.   No murmur heard. Respiratory: Effort normal and breath sounds normal. No respiratory distress. She has no wheezes. She has no rales.  GI: Soft. Bowel sounds are normal. She exhibits no distension and no mass. There is no tenderness. There is no rebound and no guarding.  Lymphadenopathy:    She has no cervical adenopathy.  Skin: Skin is warm and dry. No rash noted. She is not diaphoretic. No erythema. No pallor.  Psychiatric: She has a normal mood and affect. Her behavior is normal. Judgment and thought content normal.     Assessment/Plan HTN Obesity Leukocytosis  Symptomatic cholelithiasis with possible early cholecystitis  -admit for pain control -plan for a laparoscopic cholecystectomy in AM -Cipro -clears, NPO after midnight -IVF -c/w HBP meds -pain control/antiemetics  -SCD/lovenox  Akaiya Touchette ANP-BC 07/16/2013, 11:26 AM

## 2013-07-16 NOTE — ED Provider Notes (Signed)
CSN: [id number]     Arrival date & time 07/16/13  0744 History   None    Chief Complaint  Patient presents with  . Abdominal Pain     (Consider location/radiation/quality/duration/timing/severity/associated sxs/prior Treatment) Patient is a 43 y.o. female presenting with abdominal pain.  Abdominal Pain Associated symptoms: chills and fever   Associated symptoms: no chest pain, no constipation, no diarrhea, no dysuria, no hematuria, no nausea, no shortness of breath and no vomiting     Kerri Murillo is a 43 y.o. G4P2 female with PMH HTN, GERD, PCOS, obesity, right breast mass s/p lumpectomy in 1/15 with ultimately benign pathology, who presents with abdominal pain.  Patient reports right upper quadrant and epigastric abdominal pain that is constant, sharp, severe and radiates to her back. She has had this pain in the past and doctors have told her it was gastritis, but today the pain is more severe and has lasted longer. Endorses anorexia. Unsure if food makes the pain better or worse. Endorses subjective fevers and chills. Denies nausea, vomiting, diarrhea. Her last bowel movement was this morning at 4 AM, it was normal. No melena or bright red blood. Denies urinary symptoms. No history of gallstones. She is status post appendectomy. No other abdominal surgeries.  LMP was 4/21. She is status post tubal ligation.  PCP is Dr. Concepcion ElkAvbuere.   Past Medical History  Diagnosis Date  . GERD (gastroesophageal reflux disease)   . Hypertension   . Insulin resistance   . History of PCOS    Past Surgical History  Procedure Laterality Date  . Appendectomy    . Tubal ligation    . Tonsillectomy    . Cervix biopsy    . Breast lumpectomy with needle localization Right 04/05/2013    Procedure: BREAST LUMPECTOMY WITH NEEDLE LOCALIZATION;  Surgeon: Maisie Fushomas A. Cornett, MD;  Location: [facility];  Service: General;  Laterality: Right;   Family History  Problem Relation Age of  Onset  . Diabetes Mother   . Stroke Mother   . Heart attack Mother     pacemaker  . Heart disease Maternal Grandmother   . Breast cancer Maternal Grandmother   . Cancer Maternal Grandmother     breast   History  Substance Use Topics  . Smoking status: Former Smoker    Quit date: 03/08/2013  . Smokeless tobacco: Never Used     Comment: PT JUST SMOKE SOCAIL  . Alcohol Use: Yes     Comment: very rarely   OB History   Grav Para Term Preterm Abortions TAB SAB Ect Mult Living   4 2        2      Review of Systems  Constitutional: Positive for fever, chills and appetite change.  Respiratory: Negative for shortness of breath.   Cardiovascular: Negative for chest pain.  Gastrointestinal: Positive for abdominal pain. Negative for nausea, vomiting, diarrhea and constipation.  Genitourinary: Negative for dysuria, frequency and hematuria.      Allergies  Review of patient's allergies indicates no known allergies.  Home Medications   Prior to Admission medications   Medication Sig Start Date End Date Taking? Authorizing Provider  fexofenadine (ALLEGRA) 60 MG tablet Take 60 mg by mouth 2 (two) times daily.    Historical Provider, MD  fluticasone (FLONASE) 50 MCG/ACT nasal spray Place into both nostrils daily.    Historical Provider, MD  lisinopril-hydrochlorothiazide (PRINZIDE,ZESTORETIC) 20-25 MG per tablet Take 1 tablet by mouth daily. 02/26/11   Bruce  Romilda GarretH Swords, MD  omeprazole (PRILOSEC) 20 MG capsule Take 20 mg by mouth daily.      Historical Provider, MD  oxyCODONE-acetaminophen (ROXICET) 5-325 MG per tablet Take 1 tablet by mouth every 4 (four) hours as needed. 04/05/13   Thomas A. Cornett, MD  phentermine 37.5 MG capsule Take 37.5 mg by mouth every morning.    Historical Provider, MD   BP 115/64  Pulse 64  Temp(Src) 98.1 F (36.7 C) (Oral)  Ht 5\' 7"  (1.702 m)  Wt 305 lb (138.347 kg)  BMI 47.76 kg/m2  SpO2 100% Physical Exam  Constitutional: She is oriented to person,  place, and time. She appears well-developed and well-nourished. She appears distressed (Lying in bed in mild distress).  Morbidly obese  HENT:  Head: Normocephalic and atraumatic.  Eyes: Conjunctivae and EOM are normal. Pupils are equal, round, and reactive to light.  Neck: Normal range of motion. Neck supple.  Cardiovascular: Normal rate and regular rhythm.  Exam reveals no gallop and no friction rub.   No murmur heard. Pulmonary/Chest: Effort normal and breath sounds normal. No respiratory distress. She has no wheezes. She has no rales. She exhibits no tenderness.  Abdominal: Soft. Bowel sounds are normal. She exhibits no distension, no fluid wave and no mass. There is tenderness in the right upper quadrant and epigastric area. There is positive Murphy's sign. There is no rebound, no guarding and no CVA tenderness.  Musculoskeletal: Normal range of motion. She exhibits no edema and no tenderness.  Neurological: She is alert and oriented to person, place, and time. No cranial nerve deficit. Coordination normal.  Skin: Skin is warm and dry.  Psychiatric: She has a normal mood and affect.    ED Course  Procedures (including critical care time) Labs Review Labs Reviewed  URINALYSIS, ROUTINE W REFLEX MICROSCOPIC - Abnormal; Notable for the following:    APPearance CLOUDY (*)    Ketones, ur 15 (*)    Leukocytes, UA MODERATE (*)    All other components within normal limits  BASIC METABOLIC PANEL - Abnormal; Notable for the following:    Glucose, Bld 119 (*)    All other components within normal limits  CBC WITH DIFFERENTIAL - Abnormal; Notable for the following:    WBC 14.5 (*)    Neutrophils Relative % 88 (*)    Neutro Abs 12.7 (*)    Lymphocytes Relative 8 (*)    All other components within normal limits  HEPATIC FUNCTION PANEL - Abnormal; Notable for the following:    Albumin 3.4 (*)    All other components within normal limits  URINE MICROSCOPIC-ADD ON - Abnormal; Notable for the  following:    Squamous Epithelial / LPF FEW (*)    Bacteria, UA FEW (*)    All other components within normal limits  URINE CULTURE  PREGNANCY, URINE  LIPASE, BLOOD  TROPONIN I    Imaging Review Koreas Abdomen Limited Ruq  07/16/2013   CLINICAL DATA:  Right upper quadrant abdominal pain  EXAM: US ABDOMEN LIMITED - RIGHT UPPER QUADRANT  COMPARISON:  None.  FINDINGS: Gallbladder:  Gallstones are identified, largest 2.6 cm stone at the level of the gallbladder neck. No gallbladder wall thickening or pericholecystic fluid. No sonographic Murphy sign.  Common bile duct:  Diameter: 6 mm  Liver:  No focal lesion identified. Within normal limits in parenchymal echogenicity.  IMPRESSION: Gallstones without other sonographic evidence for acute cholecystitis.   Electronically Signed   By: Lucio EdwardGretchen  Green M.D.  On: 07/16/2013 10:43     EKG Interpretation None      MDM   Final diagnoses:  Symptomatic cholelithiasis    Kerri Murillo is a 43 y.o. G4P2 female with PMH HTN, GERD, PCOS, obesity, right breast mass s/p lumpectomy in 1/15 with ultimately benign pathology, who presents with RUQ and epigastric abdominal pain radiating to the back. Murphy sign is positive. Given body habitus, age, fertility, my index of suspicion for cholecystitis is high. Also in the differential is gastritis, peptic ulcer disease, pancreatitis, UTI/pyelonephritis, ascending cholangitis. Will obtain basic labs including hepatic function panel and lipase, and get a right upper quadrant ultrasound. Pain control with morphine.  9:30AM CBC shows leukocytosis to 14.5 with 88% left shift. Lipase and hepatic function labs are normal. Troponin is normal. Pregnancy test is negative. UA notable for moderate leukocytes, few squams, few bacteria. Negative for nitrites. No hematuria. I have added on a urine culture.  11:00AM US show gallstones without other sonographic evidence for acute cholecystitis. Will consult general surgery  for symptomatic cholelithiasis given pain, anorexia, leukocytosis.  11:45AM Surgery NP has seen the patient. Plan is admission for pain control and laparoscopic cholecystectomy in the morning.  Vivi Barrack, MD 07/16/13 [id number]

## 2013-07-17 ENCOUNTER — Encounter (HOSPITAL_COMMUNITY): Payer: Commercial Managed Care - PPO | Admitting: Anesthesiology

## 2013-07-17 ENCOUNTER — Encounter (HOSPITAL_COMMUNITY): Payer: Self-pay | Admitting: Certified Registered Nurse Anesthetist

## 2013-07-17 ENCOUNTER — Encounter (HOSPITAL_COMMUNITY)
Admission: EM | Disposition: A | Discharge status: Home / Self Care | Payer: Self-pay | Source: Home / Self Care | Attending: Emergency Medicine

## 2013-07-17 ENCOUNTER — Observation Stay (HOSPITAL_COMMUNITY): Payer: Commercial Managed Care - PPO | Admitting: Anesthesiology

## 2013-07-17 DIAGNOSIS — K8000 Calculus of gallbladder with acute cholecystitis without obstruction: Secondary | ICD-10-CM | POA: Diagnosis not present

## 2013-07-17 DIAGNOSIS — K8066 Calculus of gallbladder and bile duct with acute and chronic cholecystitis without obstruction: Secondary | ICD-10-CM

## 2013-07-17 HISTORY — PX: CHOLECYSTECTOMY: SHX55

## 2013-07-17 LAB — URINE CULTURE: Colony Count: 9000

## 2013-07-17 LAB — SURGICAL PCR SCREEN
MRSA, PCR: NEGATIVE
Staphylococcus aureus: NEGATIVE

## 2013-07-17 SURGERY — LAPAROSCOPIC CHOLECYSTECTOMY WITH INTRAOPERATIVE CHOLANGIOGRAM
Anesthesia: General | Site: Abdomen

## 2013-07-17 MED ORDER — GLYCOPYRROLATE 0.2 MG/ML IJ SOLN
INTRAMUSCULAR | Status: DC | PRN
  Administered 2013-07-17: .8 mg via INTRAVENOUS

## 2013-07-17 MED ORDER — PROPOFOL 10 MG/ML IV BOLUS
INTRAVENOUS | Status: AC
  Filled 2013-07-17: qty 20

## 2013-07-17 MED ORDER — LACTATED RINGERS IV SOLN
INTRAVENOUS | Status: DC | PRN
  Administered 2013-07-17 (×2): via INTRAVENOUS

## 2013-07-17 MED ORDER — TRAMADOL HCL 50 MG PO TABS: 50.0000 mg | ORAL_TABLET | Freq: Four times a day (QID) | ORAL | Status: DC | PRN

## 2013-07-17 MED ORDER — PROPOFOL 10 MG/ML IV BOLUS
INTRAVENOUS | Status: DC | PRN
  Administered 2013-07-17: 200 mg via INTRAVENOUS

## 2013-07-17 MED ORDER — NEOSTIGMINE METHYLSULFATE 10 MG/10ML IV SOLN
INTRAVENOUS | Status: DC | PRN
  Administered 2013-07-17: 5 mg via INTRAVENOUS

## 2013-07-17 MED ORDER — LIDOCAINE HCL (CARDIAC) 20 MG/ML IV SOLN
INTRAVENOUS | Status: AC
Start: 2013-07-17 — End: 2013-07-17
  Filled 2013-07-17: qty 5

## 2013-07-17 MED ORDER — LIDOCAINE HCL (CARDIAC) 20 MG/ML IV SOLN
INTRAVENOUS | Status: DC | PRN
  Administered 2013-07-17: 100 mg via INTRAVENOUS

## 2013-07-17 MED ORDER — ONDANSETRON HCL 4 MG/2ML IJ SOLN
INTRAMUSCULAR | Status: DC | PRN
  Administered 2013-07-17: 4 mg via INTRAVENOUS

## 2013-07-17 MED ORDER — MIDAZOLAM HCL 2 MG/2ML IJ SOLN
INTRAMUSCULAR | Status: AC
  Filled 2013-07-17: qty 2

## 2013-07-17 MED ORDER — OXYCODONE HCL 5 MG PO TABS
ORAL_TABLET | ORAL | Status: AC
  Filled 2013-07-17: qty 1

## 2013-07-17 MED ORDER — MIDAZOLAM HCL 5 MG/5ML IJ SOLN
INTRAMUSCULAR | Status: DC | PRN
  Administered 2013-07-17: 2 mg via INTRAVENOUS

## 2013-07-17 MED ORDER — ONDANSETRON HCL 4 MG/2ML IJ SOLN: 4.0000 mg | Freq: Four times a day (QID) | INTRAMUSCULAR | Status: DC | PRN

## 2013-07-17 MED ORDER — FENTANYL CITRATE 0.05 MG/ML IJ SOLN
INTRAMUSCULAR | Status: DC | PRN
  Administered 2013-07-17 (×5): 50 ug via INTRAVENOUS

## 2013-07-17 MED ORDER — 0.9 % SODIUM CHLORIDE (POUR BTL) OPTIME
TOPICAL | Status: DC | PRN
  Administered 2013-07-17: 1000 mL

## 2013-07-17 MED ORDER — FENTANYL CITRATE 0.05 MG/ML IJ SOLN
INTRAMUSCULAR | Status: AC
  Filled 2013-07-17: qty 5

## 2013-07-17 MED ORDER — OXYCODONE HCL 5 MG/5ML PO SOLN: 5.0000 mg | Freq: Once | ORAL | Status: AC | PRN

## 2013-07-17 MED ORDER — BUPIVACAINE-EPINEPHRINE 0.25% -1:200000 IJ SOLN
INTRAMUSCULAR | Status: DC | PRN
  Administered 2013-07-17: 30 mL

## 2013-07-17 MED ORDER — HEMOSTATIC AGENTS (NO CHARGE) OPTIME
TOPICAL | Status: DC | PRN
  Administered 2013-07-17: 1 via TOPICAL

## 2013-07-17 MED ORDER — CIPROFLOXACIN IN D5W 400 MG/200ML IV SOLN
400.0000 mg | INTRAVENOUS | Status: AC
  Administered 2013-07-17: 400 mg via INTRAVENOUS
  Filled 2013-07-17: qty 200

## 2013-07-17 MED ORDER — ROCURONIUM BROMIDE 50 MG/5ML IV SOLN
INTRAVENOUS | Status: AC
  Filled 2013-07-17: qty 1

## 2013-07-17 MED ORDER — HYDROMORPHONE HCL PF 1 MG/ML IJ SOLN
INTRAMUSCULAR | Status: AC
  Filled 2013-07-17: qty 1

## 2013-07-17 MED ORDER — SUCCINYLCHOLINE CHLORIDE 20 MG/ML IJ SOLN
INTRAMUSCULAR | Status: DC | PRN
  Administered 2013-07-17: 120 mg via INTRAVENOUS

## 2013-07-17 MED ORDER — HYDROMORPHONE HCL PF 1 MG/ML IJ SOLN
0.2500 mg | INTRAMUSCULAR | Status: DC | PRN
  Administered 2013-07-17 (×2): 0.5 mg via INTRAVENOUS

## 2013-07-17 MED ORDER — OXYCODONE HCL 5 MG PO TABS
5.0000 mg | ORAL_TABLET | Freq: Once | ORAL | Status: AC | PRN
  Administered 2013-07-17: 5 mg via ORAL

## 2013-07-17 MED ORDER — ONDANSETRON HCL 4 MG/2ML IJ SOLN
INTRAMUSCULAR | Status: AC
  Filled 2013-07-17: qty 2

## 2013-07-17 MED ORDER — BUPIVACAINE-EPINEPHRINE (PF) 0.25% -1:200000 IJ SOLN
INTRAMUSCULAR | Status: AC
  Filled 2013-07-17: qty 30

## 2013-07-17 MED ORDER — SODIUM CHLORIDE 0.9 % IR SOLN
Status: DC | PRN
  Administered 2013-07-17: 1000 mL

## 2013-07-17 MED ORDER — ROCURONIUM BROMIDE 100 MG/10ML IV SOLN
INTRAVENOUS | Status: DC | PRN
  Administered 2013-07-17: 20 mg via INTRAVENOUS
  Administered 2013-07-17: 30 mg via INTRAVENOUS

## 2013-07-17 SURGICAL SUPPLY — 50 items
ADH SKN CLS APL DERMABOND .7 (GAUZE/BANDAGES/DRESSINGS) ×1
APPLIER CLIP 5 13 M/L LIGAMAX5 (MISCELLANEOUS) ×3
APR CLP MED LRG 5 ANG JAW (MISCELLANEOUS) ×1
BAG SPEC RTRVL 10 TROC 200 (ENDOMECHANICALS) ×1
BLADE SURG ROTATE 9660 (MISCELLANEOUS) IMPLANT
CANISTER SUCTION 2500CC (MISCELLANEOUS) ×3 IMPLANT
CHLORAPREP W/TINT 26ML (MISCELLANEOUS) ×3 IMPLANT
CLIP APPLIE 5 13 M/L LIGAMAX5 (MISCELLANEOUS) ×1 IMPLANT
COVER MAYO STAND STRL (DRAPES) ×1 IMPLANT
COVER SURGICAL LIGHT HANDLE (MISCELLANEOUS) ×3 IMPLANT
DERMABOND ADVANCED (GAUZE/BANDAGES/DRESSINGS) ×2
DERMABOND ADVANCED .7 DNX12 (GAUZE/BANDAGES/DRESSINGS) ×1 IMPLANT
DEVICE TROCAR PUNCTURE CLOSURE (ENDOMECHANICALS) ×2 IMPLANT
DRAPE C-ARM 42X72 X-RAY (DRAPES) ×1 IMPLANT
ELECT REM PT RETURN 9FT ADLT (ELECTROSURGICAL) ×3
ELECTRODE REM PT RTRN 9FT ADLT (ELECTROSURGICAL) ×1 IMPLANT
GLOVE BIO SURGEON STRL SZ 6.5 (GLOVE) ×2 IMPLANT
GLOVE BIO SURGEON STRL SZ7 (GLOVE) ×3 IMPLANT
GLOVE BIO SURGEON STRL SZ8 (GLOVE) ×2 IMPLANT
GLOVE BIO SURGEONS STRL SZ 6.5 (GLOVE) ×1
GLOVE BIOGEL PI IND STRL 7.0 (GLOVE) IMPLANT
GLOVE BIOGEL PI IND STRL 7.5 (GLOVE) ×1 IMPLANT
GLOVE BIOGEL PI IND STRL 8 (GLOVE) IMPLANT
GLOVE BIOGEL PI INDICATOR 7.0 (GLOVE) ×2
GLOVE BIOGEL PI INDICATOR 7.5 (GLOVE) ×2
GLOVE BIOGEL PI INDICATOR 8 (GLOVE) ×2
GLOVE SURG SS PI 7.0 STRL IVOR (GLOVE) ×2 IMPLANT
GOWN STRL REUS W/ TWL LRG LVL3 (GOWN DISPOSABLE) ×4 IMPLANT
GOWN STRL REUS W/TWL LRG LVL3 (GOWN DISPOSABLE) ×12
HEMOSTAT SNOW SURGICEL 2X4 (HEMOSTASIS) ×2 IMPLANT
KIT BASIN OR (CUSTOM PROCEDURE TRAY) ×3 IMPLANT
KIT ROOM TURNOVER OR (KITS) ×3 IMPLANT
NS IRRIG 1000ML POUR BTL (IV SOLUTION) ×3 IMPLANT
PAD ARMBOARD 7.5X6 YLW CONV (MISCELLANEOUS) ×3 IMPLANT
POUCH RETRIEVAL ECOSAC 10 (ENDOMECHANICALS) ×1 IMPLANT
POUCH RETRIEVAL ECOSAC 10MM (ENDOMECHANICALS) ×2
SCISSORS LAP 5X35 DISP (ENDOMECHANICALS) ×3 IMPLANT
SET CHOLANGIOGRAPH 5 50 .035 (SET/KITS/TRAYS/PACK) ×1 IMPLANT
SET IRRIG TUBING LAPAROSCOPIC (IRRIGATION / IRRIGATOR) ×3 IMPLANT
SLEEVE ENDOPATH XCEL 5M (ENDOMECHANICALS) ×6 IMPLANT
SPECIMEN JAR SMALL (MISCELLANEOUS) ×3 IMPLANT
SUT MNCRL AB 4-0 PS2 18 (SUTURE) ×3 IMPLANT
SUT VIC AB 0 CT1 27 (SUTURE) ×6
SUT VIC AB 0 CT1 27XBRD ANBCTR (SUTURE) IMPLANT
TOWEL OR 17X24 6PK STRL BLUE (TOWEL DISPOSABLE) ×3 IMPLANT
TOWEL OR 17X26 10 PK STRL BLUE (TOWEL DISPOSABLE) ×3 IMPLANT
TRAY LAPAROSCOPIC (CUSTOM PROCEDURE TRAY) ×3 IMPLANT
TROCAR XCEL BLUNT TIP 100MML (ENDOMECHANICALS) ×3 IMPLANT
TROCAR XCEL NON-BLD 11X100MML (ENDOMECHANICALS) ×2 IMPLANT
TROCAR XCEL NON-BLD 5MMX100MML (ENDOMECHANICALS) ×3 IMPLANT

## 2013-07-17 NOTE — Anesthesia Preprocedure Evaluation (Signed)
Anesthesia Evaluation  Patient identified by MRN, date of birth, ID band Patient awake    Reviewed: Allergy & Precautions, H&P , NPO status , Patient's Chart, lab work & pertinent test results  Airway Mallampati: II  Neck ROM: full    Dental   Pulmonary former smoker,          Cardiovascular hypertension,     Neuro/Psych    GI/Hepatic GERD-  ,  Endo/Other  Morbid obesity  Renal/GU      Musculoskeletal   Abdominal   Peds  Hematology   Anesthesia Other Findings   Reproductive/Obstetrics                           Anesthesia Physical Anesthesia Plan  ASA: II  Anesthesia Plan: General   Post-op Pain Management:    Induction: Intravenous  Airway Management Planned: Oral ETT  Additional Equipment:   Intra-op Plan:   Post-operative Plan: Extubation in OR  Informed Consent: I have reviewed the patients History and Physical, chart, labs and discussed the procedure including the risks, benefits and alternatives for the proposed anesthesia with the patient or authorized representative who has indicated his/her understanding and acceptance.     Plan Discussed with: CRNA, Anesthesiologist and Surgeon  Anesthesia Plan Comments:         Anesthesia Quick Evaluation

## 2013-07-17 NOTE — Op Note (Signed)
Preoperative diagnosis: Acute cholecystitis  Postoperative diagnosis: Same as above  Procedure: Laparoscopic cholecystectomy  Surgeon: Dr. Harden MoMatt Celestine Bougie  Asst: Dr Violeta GelinasBurke Thompson Anesthesia: Gen.  Estimated blood loss: Minimal  Drains: None  Specimens: Gallbladder and contents to pathology  Complications: None  Sponge needle count was correct completion  Disposition to recovery in stable condition  Indications: This is a 43 yo morbildly obese female who was admitted for acute cholecystitis. We discussed laparoscopic cholecystectomy with risks and benefits associated with that.  Procedure: After informed consent was obtained the patient was taken to the operating room. She was given antibiotics on the floor. Sequential compression devices were on her legs. She was placed under general anesthesia without complication. Her abdomen was prepped and draped in the standard sterile surgical fashion. A surgical timeout was then performed.  Her stomach was evacuated via an orogastric tube. I accessed her peritoneum in the luq with a 5 mm optiview trocar without complication. I then made a vertical incision below her umbilicus.I inserted a 10-11 trocar and insufflated the abdomen to 15 mm mercury pressure. I then inserted 3 further 5 mm trocars in the epigastrium and right upper quadrant without complication. The gallbladder was known noted to have acute cholecystitis. It had a lot of inflammation and the omentum and duodenum were adherent. These were taken down with a combination of blunt and sharp dissection. The gallbladder was necrotic and tense. I had to aspirate it to retract. I retracted the cephalad and lateral. I then dissected in the triangle to obtain the critical view of safety. I then clipped the duct 3 times and left 2 clips in place. The duct was viable and my clips were completely across the duct. I treated the artery a similar fashion. This was also divided. I then removed the gallbladder from  the liver bed without difficulty. This was placed in a bag and removed from the umbilicus. Irrigation was performed. I obtained hemostasis. I did place a piece of surgicel snow.I then removed the umbilical trocar.I used the endoclose device and placed 3  0 vicryl sutures to obliterate the umbilical defect.I then desufflated the abdomen and remove my remaining trocars. These were all closed with 4 Monocryl and Dermabond. She tolerated this well was extubated and transferred to the recovery room stable.

## 2013-07-17 NOTE — H&P (Signed)
Agree with above I discussed the procedure in detail.   We discussed the risks and benefits of a laparoscopic cholecystectomy and possible cholangiogram including, but not limited to bleeding, infection, injury to surrounding structures such as the intestine or liver, bile leak, retained gallstones, need to convert to an open procedure, prolonged diarrhea, blood clots such as  DVT, common bile duct injury, anesthesia risks, and possible need for additional procedures.  The likelihood of improvement in symptoms and return to the patient's normal status is good. We discussed the typical post-operative recovery course.

## 2013-07-17 NOTE — Op Note (Deleted)
Preoperative diagnosis: Acute cholecystitis  Postoperative diagnosis: Same as above  Procedure: Laparoscopic cholecystectomy  Surgeon: Dr. Matt Estefani Bateson  Anesthesia: Gen.  Estimated blood loss: Minimal  Drains: None  Specimens: Gallbladder and contents to pathology  Complications: None  Sponge needle count was correct completion  Disposition to recovery in stable condition  Indications: This is a 42 yo female who was admitted for acute cholecystitis. We discussed laparoscopic cholecystectomy with risks and benefits associated with that.   Procedure: After informed consent was obtained the patient was taken to the operating room. She was given antibiotics on the floor. Sequential compression devices were on her legs. She was placed under general anesthesia without complication. Her abdomen was prepped and draped in the standard sterile surgical fashion. A surgical timeout was then performed.   Her stomach was evacuated via an orogastric tube. I made a vertical incision below the umbilicus. I incised the fascia and entered the peritoneum bluntly. I placed a 0 vicryl pursestring suture through the fascia. I then inserted a hasson trocar and insufflated the abdomen to 15 mm Hg pressure. I then inserted 3 further 5 mm trocars in the epigastrium and right upper quadrant without complication. The gallbladder was known noted to have acute cholecystitis. It had a lot of inflammation and the omentum was adherent. She had a lot of adhesions from liver to abdominal wall that I had to sharply lyse because I was unable to retract the liver. I retracted the cephalad and lateral. I then dissected in the triangle to obtain the critical view of safety. I then clipped the duct 3 times and left 2 clips in place. The duct was viable and my clips were completely across the duct. I treated the artery a similar fashion. This was also divided. I then removed the gallbladder from the liver bed without difficulty. This was  placed in a bag and removed from the umbilicus. Irrigation was performed. I obtained hemostasis.I then removed the umbilical trocar and tied down my pursestring. I then desufflated the abdomen and removed my remaining trocars. These were all closed with 4-0 Monocryl and Dermabond. She tolerated this well was extubated and transferred to the recovery room stable.   

## 2013-07-17 NOTE — Progress Notes (Signed)
Day of Surgery  Subjective: Still with ruq pain  Objective: Vital signs in last 24 hours: Temp:  [98.2 F (36.8 C)-99.1 F (37.3 C)] 99 F (37.2 C) (05/12 0644) Pulse Rate:  [54-90] 88 (05/12 0644) Resp:  [16-19] 16 (05/12 0644) BP: (105-124)/(54-97) 121/66 mmHg (05/12 0644) SpO2:  [92 %-100 %] 98 % (05/12 0644) Last BM Date: 07/16/13  Intake/Output from previous day: 05/11 0701 - 05/12 0700 In: 929 [P.O.:600; I.V.:329] Out: -  Intake/Output this shift:    GI: ruq tenderness unchanged  Lab Results:   Recent Labs  07/16/13 0835  WBC 14.5*  HGB 14.4  HCT 42.5  PLT 275   BMET  Recent Labs  07/16/13 0835  NA 139  K 4.0  CL 106  CO2 22  GLUCOSE 119*  BUN 17  CREATININE 0.59  CALCIUM 9.4   PT/INR No results found for this basename: LABPROT, INR,  in the last 72 hours ABG No results found for this basename: PHART, PCO2, PO2, HCO3,  in the last 72 hours  Studies/Results: Koreas Abdomen Limited Ruq  07/16/2013   CLINICAL DATA:  Right upper quadrant abdominal pain  EXAM: US ABDOMEN LIMITED - RIGHT UPPER QUADRANT  COMPARISON:  None.  FINDINGS: Gallbladder:  Gallstones are identified, largest 2.6 cm stone at the level of the gallbladder neck. No gallbladder wall thickening or pericholecystic fluid. No sonographic Murphy sign.  Common bile duct:  Diameter: 6 mm  Liver:  No focal lesion identified. Within normal limits in parenchymal echogenicity.  IMPRESSION: Gallstones without other sonographic evidence for acute cholecystitis.   Electronically Signed   By: Christiana PellantGretchen  Green M.D.   On: 07/16/2013 10:43    Anti-infectives: Anti-infectives   Start     Dose/Rate Route Frequency Ordered Stop   07/16/13 1230  ciprofloxacin (CIPRO) IVPB 400 mg     400 mg 200 mL/hr over 60 Minutes Intravenous Every 12 hours 07/16/13 1216        Assessment/Plan: Cholecystitis  Plan lap chole today  Emelia LoronMatthew Halston Kintz 07/17/2013

## 2013-07-17 NOTE — Anesthesia Postprocedure Evaluation (Signed)
Anesthesia Post Note  Patient: Kerri Murillo  Procedure(s) Performed: Procedure(s) (LRB): LAPAROSCOPIC CHOLECYSTECTOMY POSSIBLE IOC (N/A)  Anesthesia type: General  Patient location: PACU  Post pain: Pain level controlled and Adequate analgesia  Post assessment: Post-op Vital signs reviewed, Patient's Cardiovascular Status Stable, Respiratory Function Stable, Patent Airway and Pain level controlled  Last Vitals:  Filed Vitals:   07/17/13 1110  BP: 116/62  Pulse: 83  Temp: 37.3 C  Resp: 19    Post vital signs: Reviewed and stable  Level of consciousness: awake, alert  and oriented  Complications: No apparent anesthesia complications

## 2013-07-17 NOTE — Transfer of Care (Signed)
Immediate Anesthesia Transfer of Care Note  Patient: Kerri Murillo  Procedure(s) Performed: Procedure(s): LAPAROSCOPIC CHOLECYSTECTOMY POSSIBLE IOC (N/A)  Patient Location: PACU  Anesthesia Type:General  Level of Consciousness: awake, alert  and oriented  Airway & Oxygen Therapy: Patient Spontanous Breathing  Post-op Assessment: Report given to PACU RN  Post vital signs: Reviewed and stable  Complications: No apparent anesthesia complications

## 2013-07-18 DIAGNOSIS — K8000 Calculus of gallbladder with acute cholecystitis without obstruction: Secondary | ICD-10-CM | POA: Diagnosis not present

## 2013-07-18 MED ORDER — IBUPROFEN 600 MG PO TABS
600.0000 mg | ORAL_TABLET | Freq: Three times a day (TID) | ORAL | Status: DC
  Filled 2013-07-18: qty 1

## 2013-07-18 MED ORDER — OXYCODONE HCL 5 MG PO TABS
5.0000 mg | ORAL_TABLET | ORAL | Status: DC | PRN
  Administered 2013-07-18: 10 mg via ORAL
  Filled 2013-07-18: qty 2

## 2013-07-18 MED ORDER — OXYCODONE HCL 5 MG PO TABS: 5.0000 mg | ORAL_TABLET | ORAL | Status: DC | PRN

## 2013-07-18 NOTE — Discharge Summary (Signed)
Physician Discharge Summary  Patient ID: Kerri Murillo MRN: [id number] DOB/AGE: [birth year] 43 y.o.  Admit date: 07/16/2013 Discharge date: 07/18/2013  Admitting Diagnosis: Acute cholecystitis   Discharge Diagnosis Patient Active Problem List   Diagnosis Date Noted  . Symptomatic cholelithiasis 07/16/2013  . Mass of breast, right 03/26/2013  . Obesity 02/26/2011  . HYPERTENSION 12/27/2006  . GERD 12/27/2006    Consultants none  Imaging: Koreas Abdomen Limited Ruq  07/16/2013   CLINICAL DATA:  Right upper quadrant abdominal pain  EXAM: US ABDOMEN LIMITED - RIGHT UPPER QUADRANT  COMPARISON:  None.  FINDINGS: Gallbladder:  Gallstones are identified, largest 2.6 cm stone at the level of the gallbladder neck. No gallbladder wall thickening or pericholecystic fluid. No sonographic Murphy sign.  Common bile duct:  Diameter: 6 mm  Liver:  No focal lesion identified. Within normal limits in parenchymal echogenicity.  IMPRESSION: Gallstones without other sonographic evidence for acute cholecystitis.   Electronically Signed   By: Christiana PellantGretchen  Green M.D.   On: 07/16/2013 10:43    Procedures Laparoscopic cholecystectomy---Dr. Dwain SarnaWakefield 07/17/13  Hospital Course:  Kerri Murillo is a 43 year old female with a history of HTN and obesity who presents with abdominal pain.   White count is 14K, LFTs are normal. An US shows gallstones without evidence of acute cholecystitis.  Patient was admitted and underwent procedure listed above.  Tolerated procedure well and was transferred to the floor.  Diet was advanced as tolerated.  On POD#1, the patient was voiding well, tolerating diet, ambulating well, pain well controlled, vital signs stable, incisions c/d/i and felt stable for discharge home.  Patient will follow up in our office in 2 weeks and knows to call with questions or concerns.  Physical Exam: General:  Alert, NAD, pleasant, comfortable Abd:  Soft, ND, mild tenderness, incisions C/D/I     Medication List    STOP taking these medications       phentermine 37.5 MG capsule      TAKE these medications       acetaminophen 325 MG tablet  Commonly known as:  TYLENOL  Take 650 mg by mouth every 6 (six) hours as needed for mild pain.     acidophilus Caps capsule  Take 1 capsule by mouth daily.     CINNAMON PO  Take 1 tablet by mouth daily.     COCONUT OIL PO  Take 1 tablet by mouth daily.     fexofenadine 60 MG tablet  Commonly known as:  ALLEGRA  Take 60 mg by mouth 2 (two) times daily.     FLAX SEED OIL PO  Take 1 tablet by mouth daily.     fluticasone 50 MCG/ACT nasal spray  Commonly known as:  FLONASE  Place into both nostrils daily.     lisinopril-hydrochlorothiazide 20-25 MG per tablet  Commonly known as:  PRINZIDE,ZESTORETIC  Take 0.5 tablets by mouth daily.     omeprazole 20 MG capsule  Commonly known as:  PRILOSEC  Take 20 mg by mouth daily.     traMADol 50 MG tablet  Commonly known as:  ULTRAM  Take 50 mg by mouth every 6 (six) hours as needed for moderate pain.     VITAMIN B-12 PO  Take 1 tablet by mouth daily.     VITAMIN E PO  Take 1 tablet by mouth daily.             Follow-up Information   Follow up with [facility]  [place] On 08/14/2013. (arrive by 1PM for a 1:30PM appt for a post op check)    Contact information:   [address]   [place] [address] [phone]       Signed: Ashok Norrismina Granville Whitefield, [facility]BC [facility] [phone]  07/18/2013, 10:17 AM

## 2013-07-18 NOTE — Progress Notes (Signed)
Discussed discharge summary with pt. Reviewed medications with pt.  Pt received Rx. Pt had no further questions. Claude Mangesara S Natanel Snavely , RN

## 2013-07-18 NOTE — Progress Notes (Signed)
1 Day Post-Op  Subjective: No flatus.  Voiding.  Up in room.  Tolerating a diet.  Still having pain.    Objective: Vital signs in last 24 hours: Temp:  [97.8 F (36.6 C)-99.6 F (37.6 C)] 97.8 F (36.6 C) (05/13 0628) Pulse Rate:  [69-109] 109 (05/13 0628) Resp:  [15-20] 20 (05/13 0628) BP: (111-124)/(48-76) 116/76 mmHg (05/13 0628) SpO2:  [92 %-98 %] 96 % (05/13 0628) Last BM Date: 07/16/13  Intake/Output from previous day: 05/12 0701 - 05/13 0700 In: 6431.7 [P.O.:720; I.V.:4911.7; IV Piggyback:800] Out: 650 [Urine:650] Intake/Output this shift:   PE General appearance: alert, cooperative and no distress Resp: clear to auscultation bilaterally Cardio: regular rate and rhythm, S1, S2 normal, no murmur, click, rub or gallop GI: +bs, abdomen is soft, appropriately tender. incisions are c/d/i.   Extremities: extremities normal, atraumatic, no cyanosis or edema  Lab Results:   Recent Labs  07/16/13 0835  WBC 14.5*  HGB 14.4  HCT 42.5  PLT 275   BMET  Recent Labs  07/16/13 0835  NA 139  K 4.0  CL 106  CO2 22  GLUCOSE 119*  BUN 17  CREATININE 0.59  CALCIUM 9.4   PT/INR No results found for this basename: LABPROT, INR,  in the last 72 hours ABG No results found for this basename: PHART, PCO2, PO2, HCO3,  in the last 72 hours  Studies/Results: [place] Abdomen Limited Ruq  07/16/2013   CLINICAL DATA:  Right upper quadrant abdominal pain  EXAM: US ABDOMEN LIMITED - RIGHT UPPER QUADRANT  COMPARISON:  None.  FINDINGS: Gallbladder:  Gallstones are identified, largest 2.6 cm stone at the level of the gallbladder neck. No gallbladder wall thickening or pericholecystic fluid. No sonographic Murphy sign.  Common bile duct:  Diameter: 6 mm  Liver:  No focal lesion identified. Within normal limits in parenchymal echogenicity.  IMPRESSION: Gallstones without other sonographic evidence for acute cholecystitis.   Electronically Signed   By: Christiana PellantGretchen  Green M.D.   On: 07/16/2013 10:43     Anti-infectives: Anti-infectives   Start     Dose/Rate Route Frequency Ordered Stop   07/17/13 1015  ciprofloxacin (CIPRO) IVPB 400 mg     400 mg 200 mL/hr over 60 Minutes Intravenous To Surgery 07/17/13 1009 07/17/13 1014   07/16/13 1230  ciprofloxacin (CIPRO) IVPB 400 mg     400 mg 200 mL/hr over 60 Minutes Intravenous Every 12 hours 07/16/13 1216        Assessment/Plan: Acute cholecystitis  S/p laparoscopic cholecystectomy---Dr. Dwain SarnaWakefield 07/17/13 -ambulate -add ibuprofen and increase oxycodone -IS -cipro while inpatient -DC IVF -SCD/lovenox -re-evaluate this afternoon and determine if she is ready for discharge home today     LOS: 2 days    Bret Stamour ANP-BC 07/18/2013 8:53 AM

## 2013-07-18 NOTE — Discharge Instructions (Signed)

## 2013-07-18 NOTE — Progress Notes (Signed)
Hopefully home later, a little ecchymosis at umbilical wound

## 2013-07-20 ENCOUNTER — Encounter (HOSPITAL_COMMUNITY): Payer: Self-pay | Admitting: General Surgery

## 2013-07-25 ENCOUNTER — Encounter (INDEPENDENT_AMBULATORY_CARE_PROVIDER_SITE_OTHER): Payer: Self-pay

## 2013-08-14 ENCOUNTER — Ambulatory Visit (INDEPENDENT_AMBULATORY_CARE_PROVIDER_SITE_OTHER): Payer: Medicaid Other | Admitting: General Surgery

## 2013-08-14 ENCOUNTER — Encounter (INDEPENDENT_AMBULATORY_CARE_PROVIDER_SITE_OTHER): Payer: Self-pay

## 2013-08-14 ENCOUNTER — Encounter (INDEPENDENT_AMBULATORY_CARE_PROVIDER_SITE_OTHER): Payer: Self-pay | Admitting: *Deleted

## 2013-08-14 VITALS — BP 128/82 | HR 80 | Temp 97.6°F | Ht 66.0 in | Wt 314.0 lb

## 2013-08-14 DIAGNOSIS — K8000 Calculus of gallbladder with acute cholecystitis without obstruction: Secondary | ICD-10-CM

## 2013-08-14 NOTE — Patient Instructions (Signed)
Keep the umbilicus site clean and dry, wash with soap and water, till it has healed like the other sites.

## 2013-08-14 NOTE — Progress Notes (Signed)
Kerri Murillo Nov 26, 1970 [id number] 08/14/2013   Kerri Murillo is a 43 y.o. female who had a laparoscopic cholecystectomy with intraoperative cholangiogram by Dr. Emelia Loron.  The pathology report confirmed:  Gallbladder - ACUTE AND CHRONIC CHOLECYSTITIS WITH NECROSIS AND CHOLELITHIASIS. - THERE IS NO EVIDENCE OF MALIGNANCY..  The patient reports that they are feeling well with normal bowel movements and good appetite.  The pre-operative symptoms of abdominal pain, nausea, and vomiting have resolved.    Physical examination - Incisions appear well-healed with no sign of infection or bleeding.   Abdomen - soft, non-tender.  Umbilical site slow healing and on abx from PCP FOR  A time.  Slowly improving, it is within the panus when she is sitting. BP 128/82  Pulse 80  Temp(Src) 97.6 F (36.4 C)  Ht 5\' 6"  (1.676 m)  Wt 142.429 kg (314 lb)  BMI 50.70 kg/m2  LMP 06/24/2013  Impression:  s/p laparoscopic cholecystectomy  Plan:  She may resume a regular diet and full activity.  She may follow-up on a PRN basis.

## 2014-01-07 ENCOUNTER — Encounter (INDEPENDENT_AMBULATORY_CARE_PROVIDER_SITE_OTHER): Payer: Self-pay

## 2014-02-19 ENCOUNTER — Other Ambulatory Visit: Payer: Self-pay

## 2014-02-19 DIAGNOSIS — Z1231 Encounter for screening mammogram for malignant neoplasm of breast: Secondary | ICD-10-CM

## 2014-03-11 ENCOUNTER — Ambulatory Visit
Admission: RE | Admit: 2014-03-11 | Discharge: 2014-03-11 | Disposition: A | Discharge status: Home / Self Care | Payer: Commercial Managed Care - PPO | Source: Ambulatory Visit

## 2014-03-11 DIAGNOSIS — Z1231 Encounter for screening mammogram for malignant neoplasm of breast: Secondary | ICD-10-CM

## 2014-03-27 ENCOUNTER — Encounter: Payer: Self-pay | Admitting: Internal Medicine

## 2016-05-07 ENCOUNTER — Other Ambulatory Visit: Payer: Self-pay | Admitting: Obstetrics and Gynecology

## 2020-10-14 ENCOUNTER — Other Ambulatory Visit: Payer: Self-pay | Admitting: Physician Assistant

## 2020-10-14 DIAGNOSIS — F17200 Nicotine dependence, unspecified, uncomplicated: Secondary | ICD-10-CM

## 2020-10-30 ENCOUNTER — Ambulatory Visit: Payer: Commercial Managed Care - PPO

## 2023-09-02 ENCOUNTER — Other Ambulatory Visit: Payer: Self-pay | Admitting: Obstetrics and Gynecology

## 2023-09-02 DIAGNOSIS — Z1231 Encounter for screening mammogram for malignant neoplasm of breast: Secondary | ICD-10-CM

## 2023-12-27 ENCOUNTER — Other Ambulatory Visit: Payer: Self-pay | Admitting: Physician Assistant

## 2023-12-27 DIAGNOSIS — Z1231 Encounter for screening mammogram for malignant neoplasm of breast: Secondary | ICD-10-CM
